# Patient Record
Sex: Female | Born: 1950
Health system: Southern US, Community
[De-identification: ages and names within clinical notes are randomized; demographics above are authoritative.]

## PROBLEM LIST (undated history)

## (undated) DIAGNOSIS — Z9109 Other allergy status, other than to drugs and biological substances: Secondary | ICD-10-CM

## (undated) DIAGNOSIS — E7800 Pure hypercholesterolemia, unspecified: Secondary | ICD-10-CM

## (undated) DIAGNOSIS — G610 Guillain-Barre syndrome: Secondary | ICD-10-CM

## (undated) DIAGNOSIS — Z8619 Personal history of other infectious and parasitic diseases: Secondary | ICD-10-CM

## (undated) DIAGNOSIS — H9190 Unspecified hearing loss, unspecified ear: Secondary | ICD-10-CM

## (undated) HISTORY — PX: MEDIAL PARTIAL KNEE REPLACEMENT: SHX5965

## (undated) HISTORY — DX: Guillain-Barre syndrome: G61.0

## (undated) HISTORY — PX: KNEE ARTHROSCOPY: SUR90

## (undated) HISTORY — DX: Unspecified hearing loss, unspecified ear: H91.90

## (undated) HISTORY — PX: ABDOMINAL HYSTERECTOMY: SHX81

## (undated) HISTORY — DX: Personal history of other infectious and parasitic diseases: Z86.19

## (undated) HISTORY — PX: FOOT SURGERY: SHX648

## (undated) HISTORY — PX: EYE SURGERY: SHX253

---

## 1997-04-27 ENCOUNTER — Other Ambulatory Visit: Admission: RE | Admit: 1997-04-27 | Discharge: 1997-04-27 | Payer: Self-pay | Admitting: Obstetrics and Gynecology

## 1998-06-30 ENCOUNTER — Other Ambulatory Visit: Admission: RE | Admit: 1998-06-30 | Discharge: 1998-06-30 | Payer: Self-pay | Admitting: Obstetrics and Gynecology

## 1999-06-26 ENCOUNTER — Encounter: Admission: RE | Admit: 1999-06-26 | Discharge: 1999-06-26 | Payer: Self-pay | Admitting: Obstetrics and Gynecology

## 1999-06-26 ENCOUNTER — Encounter: Payer: Self-pay | Admitting: Obstetrics and Gynecology

## 1999-07-18 ENCOUNTER — Other Ambulatory Visit: Admission: RE | Admit: 1999-07-18 | Discharge: 1999-07-18 | Payer: Self-pay | Admitting: Obstetrics and Gynecology

## 2000-07-02 ENCOUNTER — Encounter: Payer: Self-pay | Admitting: Obstetrics and Gynecology

## 2000-07-02 ENCOUNTER — Encounter: Admission: RE | Admit: 2000-07-02 | Discharge: 2000-07-02 | Payer: Self-pay | Admitting: Obstetrics and Gynecology

## 2000-08-07 ENCOUNTER — Other Ambulatory Visit: Admission: RE | Admit: 2000-08-07 | Discharge: 2000-08-07 | Payer: Self-pay | Admitting: Obstetrics and Gynecology

## 2001-07-03 ENCOUNTER — Encounter: Payer: Self-pay | Admitting: Obstetrics and Gynecology

## 2001-07-03 ENCOUNTER — Encounter: Admission: RE | Admit: 2001-07-03 | Discharge: 2001-07-03 | Payer: Self-pay | Admitting: Obstetrics and Gynecology

## 2002-07-06 ENCOUNTER — Encounter: Admission: RE | Admit: 2002-07-06 | Discharge: 2002-07-06 | Payer: Self-pay | Admitting: Obstetrics and Gynecology

## 2002-07-06 ENCOUNTER — Encounter: Payer: Self-pay | Admitting: Obstetrics and Gynecology

## 2003-07-09 ENCOUNTER — Encounter: Admission: RE | Admit: 2003-07-09 | Discharge: 2003-07-09 | Payer: Self-pay | Admitting: Obstetrics and Gynecology

## 2004-10-06 ENCOUNTER — Encounter: Admission: RE | Admit: 2004-10-06 | Discharge: 2004-10-06 | Payer: Self-pay | Admitting: Obstetrics and Gynecology

## 2005-10-15 ENCOUNTER — Encounter: Admission: RE | Admit: 2005-10-15 | Discharge: 2005-10-15 | Payer: Self-pay | Admitting: Obstetrics and Gynecology

## 2005-10-22 ENCOUNTER — Encounter: Admission: RE | Admit: 2005-10-22 | Discharge: 2005-10-22 | Payer: Self-pay | Admitting: Obstetrics and Gynecology

## 2007-09-07 ENCOUNTER — Emergency Department (HOSPITAL_BASED_OUTPATIENT_CLINIC_OR_DEPARTMENT_OTHER): Admission: EM | Admit: 2007-09-07 | Discharge: 2007-09-07 | Payer: Self-pay | Admitting: Emergency Medicine

## 2007-11-23 ENCOUNTER — Emergency Department (HOSPITAL_BASED_OUTPATIENT_CLINIC_OR_DEPARTMENT_OTHER): Admission: EM | Admit: 2007-11-23 | Discharge: 2007-11-23 | Payer: Self-pay | Admitting: Emergency Medicine

## 2008-07-14 ENCOUNTER — Emergency Department (HOSPITAL_BASED_OUTPATIENT_CLINIC_OR_DEPARTMENT_OTHER): Admission: EM | Admit: 2008-07-14 | Discharge: 2008-07-14 | Payer: Self-pay | Admitting: Emergency Medicine

## 2008-08-09 ENCOUNTER — Inpatient Hospital Stay (HOSPITAL_COMMUNITY): Admission: RE | Admit: 2008-08-09 | Discharge: 2008-08-10 | Payer: Self-pay | Admitting: Orthopedic Surgery

## 2008-08-18 ENCOUNTER — Emergency Department (HOSPITAL_BASED_OUTPATIENT_CLINIC_OR_DEPARTMENT_OTHER): Admission: EM | Admit: 2008-08-18 | Discharge: 2008-08-19 | Payer: Self-pay | Admitting: Emergency Medicine

## 2008-08-30 ENCOUNTER — Inpatient Hospital Stay (HOSPITAL_COMMUNITY): Admission: RE | Admit: 2008-08-30 | Discharge: 2008-08-31 | Payer: Self-pay | Admitting: Orthopedic Surgery

## 2010-01-22 LAB — HM COLONOSCOPY: HM COLON: NORMAL

## 2010-04-29 LAB — BASIC METABOLIC PANEL
BUN: 11 mg/dL (ref 6–23)
BUN: 12 mg/dL (ref 6–23)
CO2: 27 mEq/L (ref 19–32)
Calcium: 8.7 mg/dL (ref 8.4–10.5)
Calcium: 9.7 mg/dL (ref 8.4–10.5)
Chloride: 105 mEq/L (ref 96–112)
Creatinine, Ser: 0.71 mg/dL (ref 0.4–1.2)
GFR calc Af Amer: >60 mL/min (ref >60)
GFR calc Af Amer: >60 mL/min (ref >60)
GFR calc non Af Amer: >60 mL/min (ref >60)
Glucose, Bld: 190 mg/dL — ABNORMAL HIGH (ref 70–99)
Glucose, Bld: 97 mg/dL (ref 70–99)
Potassium: 3.7 mEq/L (ref 3.5–5.1)
Potassium: 4.4 mEq/L (ref 3.5–5.1)
Sodium: 141 mEq/L (ref 135–145)

## 2010-04-29 LAB — TYPE AND SCREEN: ABO/RH(D): O POS

## 2010-04-29 LAB — URINE MICROSCOPIC-ADD ON

## 2010-04-29 LAB — DIFFERENTIAL
Basophils Relative: 0 % (ref 0–1)
Eosinophils Absolute: 0.2 10*3/uL (ref 0.0–0.7)
Eosinophils Relative: 3 % (ref 0–5)
Monocytes Absolute: 0.3 10*3/uL (ref 0.1–1.0)
Monocytes Relative: 6 % (ref 3–12)

## 2010-04-29 LAB — URINALYSIS, ROUTINE W REFLEX MICROSCOPIC
Bilirubin Urine: NEGATIVE
Glucose, UA: NEGATIVE mg/dL
Specific Gravity, Urine: 1.021 (ref 1.005–1.030)
Urobilinogen, UA: 0.2 mg/dL (ref 0.0–1.0)

## 2010-04-29 LAB — CBC
HCT: 38.5 % (ref 36.0–46.0)
Hemoglobin: 10.2 g/dL — ABNORMAL LOW (ref 12.0–15.0)
Hemoglobin: 13.3 g/dL (ref 12.0–15.0)
Platelets: 221 10*3/uL (ref 150–400)
RDW: 13.4 % (ref 11.5–15.5)

## 2010-04-29 LAB — PROTIME-INR: INR: 1 (ref 0.00–1.49)

## 2010-04-29 LAB — APTT: aPTT: 27 seconds (ref 24–37)

## 2010-04-30 LAB — BASIC METABOLIC PANEL
BUN: 12 mg/dL (ref 6–23)
BUN: 7 mg/dL (ref 6–23)
CO2: 30 mEq/L (ref 19–32)
Chloride: 105 mEq/L (ref 96–112)
Chloride: 109 mEq/L (ref 96–112)
Creatinine, Ser: 0.7 mg/dL (ref 0.4–1.2)
Creatinine, Ser: 0.73 mg/dL (ref 0.4–1.2)
GFR calc Af Amer: >60 mL/min (ref >60)
GFR calc non Af Amer: >60 mL/min (ref >60)
Sodium: 141 mEq/L (ref 135–145)
Sodium: 144 mEq/L (ref 135–145)

## 2010-04-30 LAB — CBC
Hemoglobin: 11 g/dL — ABNORMAL LOW (ref 12.0–15.0)
Hemoglobin: 13.5 g/dL (ref 12.0–15.0)
MCHC: 34.2 g/dL (ref 30.0–36.0)
MCHC: 34.4 g/dL (ref 30.0–36.0)
MCV: 86.3 fL (ref 78.0–100.0)
MCV: 86.6 fL (ref 78.0–100.0)
RBC: 3.72 MIL/uL — ABNORMAL LOW (ref 3.87–5.11)
RDW: 12.5 % (ref 11.5–15.5)
RDW: 12.8 % (ref 11.5–15.5)

## 2010-04-30 LAB — URINALYSIS, ROUTINE W REFLEX MICROSCOPIC
Nitrite: NEGATIVE
Protein, ur: NEGATIVE mg/dL
Urobilinogen, UA: 0.2 mg/dL (ref 0.0–1.0)

## 2010-04-30 LAB — DIFFERENTIAL
Basophils Relative: 1 % (ref 0–1)
Eosinophils Absolute: 0.1 10*3/uL (ref 0.0–0.7)
Neutro Abs: 1.6 10*3/uL — ABNORMAL LOW (ref 1.7–7.7)

## 2010-04-30 LAB — ABO/RH: ABO/RH(D): O POS

## 2010-04-30 LAB — APTT: aPTT: 29 seconds (ref 24–37)

## 2010-04-30 LAB — PROTIME-INR
INR: 1 (ref 0.00–1.49)
Prothrombin Time: 13.2 seconds (ref 11.6–15.2)

## 2010-06-06 NOTE — Op Note (Signed)
[name], [name]              ACCOUNT NO.:  [id number]   MEDICAL RECORD NO.:  [id number]          PATIENT TYPE:  INP   LOCATION:  1611                         FACILITY:  [facility]   PHYSICIAN:  Madlyn Frankel. Charlann Boxer, M.D.  DATE OF BIRTH:  05-29-1950   DATE OF PROCEDURE:  08/30/2008  DATE OF DISCHARGE:                               OPERATIVE REPORT   PREOPERATIVE DIAGNOSIS:  Right knee medial compartment osteoarthritis.   POSTOPERATIVE DIAGNOSIS:  Right knee medial compartment osteoarthritis.   ROCEDURE:  Right partial knee replacement utilizing a Biomet size medium  femoral component and A right medial tibial component size 6  polyethylene insert.   SURGEON:  Madlyn Frankel. Charlann Boxer, M.D.   ASSISTANT:  Dwyane Luo, PA-C   ANESTHESIA:  Spinal.   DRAINS:  One Hemovac.   TOURNIQUET TIME:  35 minutes 250 mmHg.   COMPLICATIONS:  None.   SPECIMENS:  None.   INDICATIONS FOR PROCEDURE:  [name] is a 60 year old female who  presented to the office with bilateral knee osteoarthritis.  We had  already done a left partial knee replacement that she had done very well  from.  She is here for scheduled staged bilateral partial knee  replacement.  We reviewed the risks and benefits at her first initial  postop from the left knee.  She is ready to proceed without hesitation.   PROCEDURE IN DETAIL:  The patient was brought to operative theater.  Once adequate anesthesia and preoperative antibiotics, Cleocin  administered, the patient was positioned supine.  The right leg was  placed in a proximal thigh tourniquet.  The right lower extremity was  then prepped and draped in sterile fashion.  A time-out was performed  identifying the patient, planned procedure and extremity.   A paramidline incision was made following exsanguination of the  extremity.  Sharp dissection was carried down to the extensor mechanism.  Following creation of soft tissue planes, a median arthrotomy was made.  A median  parapatellar arthrotomy was then made just proximal to the pole  patella.  Sharp dissection was carried out including synovectomy.  Following removal of osteophytes along the medial femoral condyle and  distally anteriorly, attention was first directed to the tibia.  The  extramedullary guide was placed on the proximal tibia, pinned in  position.  The reciprocating saw was used first followed by oscillating  saw to remove the proximal medial tibial wedge.   Following this, the cut surface would be best fit with a size A.   Following this cut, the femoral canal was opened with the drill and awl  used to increase the size of the opening.  Intramedullary rod was then  passed.   With a size 3 feeler gauge in place, the guide the guide for drill holes  into the medial femoral condyle was placed on top of the size 3 feeler  gauge.  Once it was parallel in both planes, the drill holes were made  into the midportion of medial femoral condyle.  The posterior cutting  block was then placed and the posterior cut made without difficulty.  I then used a size 4 spigot as I had already trialed f the size 4 feeler  gauge which felt good.  The distal femur was milled the excessive bone  was removed.   A trial reduction now carried out with a medium femur.  An A tibial tray  and a size 5 feeler gauge actually felt better initially.   It seemed to be balanced.  No further milling was taken care of on the  distal femur.   At this point, the trial components were removed.  I was able to get to  the posterior aspect of the knee and remove more meniscus.   Following the removal of the remaining tissues, the final preparation of  tibia was carried out.  The initial tray was pinned in position.  A  reciprocating saw was then used to remove the bone from the keeled area.  I then used an osteotome to create the keeled trough within the tibia  component.  Trial reduction now carried out with the medial  femur, a  size A keeled tibia component.  When I did the trial components.  I felt  that there going to be either a 5 or 6 final component based on the  balancing.  Nonetheless, the ligaments were balanced from extension to  flexion.   At this point, all trial components removed and we injected synovial  capsule junction with 0.25% Marcaine with epinephrine.   The cement was prepared and the final medium femur and A component was  opened.   The wound was irrigated with normal saline solution.  Final components  were cemented in position with a single cement but in two stages.  The  tibial component was cemented in first, held at 45 degrees of flexion  with a trial femur in place.  Extruded cement was removed, the final  medium femur was then cemented in place and again held at 45 degrees of  flexion with a size 5 feeler gauge.   Once the cement had cured and excess cement was removed, I retrialed.  During this retrial I chose to go up to a size 6 as my final component.  The final 6 component was then opened and snapped into position without  difficulty.   The knee was reirrigated with normal saline solution.  Tourniquet had  been let down after 35 minutes.  A medium Hemovac drains was placed  deep.  After final irrigation, the extensor mechanism was reapproximated  using 1-0  Vicryl.  The remaining wound was closed with 2-0 Vicryl and a running 4-  0 Monocryl.  The knee was cleaned, dried and dressed sterilely with  Steri-Strips and bulky sterile wrap and she was then brought to the  recovery room in stable condition tolerating the procedure well.      Madlyn Frankel Charlann Boxer, M.D.  Electronically Signed     MDO/MEDQ  D:  08/30/2008  T:  08/30/2008  Job:  [id number]

## 2010-06-06 NOTE — H&P (Signed)
[name], [name]              ACCOUNT NO.:  [id number]   MEDICAL RECORD NO.:  [id number]          PATIENT TYPE:  INP   LOCATION:                               FACILITY:  [facility]   PHYSICIAN:  Madlyn Frankel. Charlann Boxer, M.D.  DATE OF BIRTH:  1950-06-26   DATE OF ADMISSION:  08/30/2008  DATE OF DISCHARGE:                              HISTORY & PHYSICAL   PROCEDURE:  Right unicondylar knee replacement of the medial  compartment.   CHIEF COMPLAINT:  Right knee pain.   HISTORY OF PRESENT ILLNESS:  A 60 year old female with a history of  right medial knee pain with medial compartment osteoarthritis.  It has  been refractory to all conservative treatment.  She has a recent history  of a left partial knee replacement and has done well with this.   PRIMARY CARE PHYSICIAN:  Dr. Derrell Lolling.   PAST MEDICAL HISTORY:  1. Osteoarthritis.  2. Vertigo.  3. Impaired hearing.   PAST SURGICAL HISTORY:  1. Left partial knee replacement.  2. Partial hysterectomy.  3. Toe surgery.  4. LASIK surgery right eye.   FAMILY HISTORY:  Arthritis, heart disease, hypertension, reflux disease,  COPD, kidney disease.   SOCIAL HISTORY:  Married, nonsmoker.  Primary caregiver is spouse in the  home.   DRUG ALLERGIES:  1. PENICILLIN causes hives and swelling.  2. DARVOCET causes severe swelling including swelling of the tongue.  3. HYDROCODONE severe itching.  4. ROBAXIN severe headache.   MEDICATIONS:  1. Claritin over-the-counter daily.  2. Vitamin C daily.  3. Calcium with vitamin D b.i.d.  4. Tylenol Arthritis p.r.n.  5. Tramadol 50 mg one to two p.o. q.4-6 hours p.r.n. pain.  6. Mobic 7.5 mg one p.o. b.i.d. x2 weeks after surgery.   REVIEW OF SYSTEMS:  HEENT/NEUROLOGICAL:  She has some hearing loss.  RESPIRATORY: She has allergies.  MUSCULOSKELETAL:  Joint pain.  Otherwise see HPI.   PHYSICAL EXAMINATION:  VITAL SIGNS:  Pulse 72, respirations 16, blood  pressure 134/88.  GENERAL:  Awake, alert and  oriented.  HEENT:  Normocephalic.  NECK:  Supple.  No carotid bruits.  CHEST:  Lungs clear to auscultation bilaterally.  BREASTS:  Deferred.  HEART:  S1-S2 distinct.  ABDOMEN:  Soft, nontender, bowel sounds present.  PELVIS:  Stable.  GENITOURINARY:  Deferred.  EXTREMITIES:  Left knee she is already 3-110.  Right knee shows medial  sided tenderness.  Normal range of motion.  SKIN:  No cellulitis.  NEUROLOGIC:  Intact distal sensibilities.   LABORATORY DATA:  Labs, EKG, chest x-ray pending.   IMPRESSION:  Right knee osteoarthritis of the medial compartment.   PLAN OF ACTION:  Is a right unicondylar knee replacement of the medial  compartment by Dr. Charlann Boxer at [facility] on August 30, 2008.  The risks and  complications were discussed.     ______________________________  [name], [place]      Madlyn Frankel. Charlann Boxer, M.D.  Electronically Signed    BLM/MEDQ  D:  08/26/2008  T:  08/26/2008  Job:  [id number]   cc:   Dr Derrell Lolling

## 2010-06-06 NOTE — Op Note (Signed)
[name], [name]              ACCOUNT NO.:  [id number]   MEDICAL RECORD NO.:  [id number]          PATIENT TYPE:  INP   LOCATION:  1611                         FACILITY:  [facility]   PHYSICIAN:  Madlyn Frankel. Charlann Boxer, M.D.  DATE OF BIRTH:  12/01/1950   DATE OF PROCEDURE:  DATE OF DISCHARGE:                               OPERATIVE REPORT   PREOPERATIVE DIAGNOSIS:  Left knee medial compartment osteoarthritis.   POSTOPERATIVE DIAGNOSIS:  Left knee medial compartment osteoarthritis.   PROCEDURE:  Left partial knee replacement using Biomet Oxford knee  system size medium femur, A left medial tibial tray and 5 insert matched  the medial femur.   SURGEON:  Madlyn Frankel. Charlann Boxer, M.D.   ASSISTANT:  Yetta Glassman. Loreta Ave, PA   ANESTHESIA:  Spinal.   SPECIMENS:  None.   COMPLICATIONS:  None.   DRAINS:  One Hemovac.   TOURNIQUET TIME:  42 minutes at 250 mmHg.   INDICATIONS FOR PROCEDURE:  [name] is a healthy 60 year old female  who we have been following for bilateral knee osteoarthritis  predominantly with genu varum with pain and symptoms medially failing to  respond to conservative measures  It was felt she was an ideal candidate  at this point based on the location of pain and radiographs for a  partial knee replacement.  We discussed the risks and benefits, pros and  cons of partial versus total knee replacement.  Consent was obtained for  partial knee replacement.   PROCEDURE IN DETAIL:  The patient was brought to operative theater.  Once adequate anesthesia, preoperative antibiotics, clindamycin  administered, the patient was positioned supine with a thigh tourniquet  placed.  The left lower extremity was prepped and draped in sterile  fashion.  Time-out was performed identifying the patient, planned  procedure, extremity.  The leg was exsanguinated, tourniquet elevated to  250 mmHg.  Paramidline incision was made from the patella to the tibial  tubercle.  Soft tissue planes created.   Arthrotomy was created.  Following debridement of synovium, I removed osteophytes on the distal  aspect of the femur.   Using extramedullary guide placed just beneath the osteophytic rim of  the proximal and medial tibia, we made a resection with reciprocating  saw onto the notch into the area of origin of ACL then using oscillating  saw, removed the anterior proximal tibia fragment.   This was done perpendicular in both planes.   The femoral canal was then opened with a drill, a starting awl was used  to open this up a bit more.  I then placed an intramedullary rod.  With  a size 3 feeler gauge on top of an A tibial tray, the drill holes for  the posterior cutting guide were then made into the middle portion of  medial femoral condyle in relationship to the intramedullary rod.   Based on the fact that with an A tibial tray in place and the fact that  the 4 feeler gauge passed with appropriate amount of tension with the  knee in 90 degrees of flexion,  I went ahead and placed the 4  spigot and  milled the distal femur.  I removed excessive bone.  Trial reduction was  now carried out with a medium femur, A tibial tray with a 4 insert.  It  felt good in flexion but when I came to extension, it was a little bit  more snug.   Based on this, I went ahead and removed that and placed a 5 spigot and  milled the distal femur.   Retrialing confirmed the balancing of the knee from extension to  flexion.  At this point following further debridements on an as needed  basis around the knee, we did final preparation of the tibia.  With the  tibial tray held into position, it was pinned with a reciprocating saw  to create the trough.  I used the osteotome to remove some of the bone  from this area to allow the keel to sit on it.  Trial reduction now  carried out with a medium femur, keeled A tibial tray and the lollipop  trials with a 5 feeler gauge.  At 90 degrees of flexion there was 1 to 2  mm  of play and then at 20 degrees of flexion the same.  Based on this,  the trial components were removed.  I drilled holes into the sclerotic  distal femur.   Final components were opened excluding the polyethylene at this point.  Cement was mixed.  The tibial component was cemented in first.  The  trial femur placed and the knee brought to 45 degrees of flexion for 2  minutes.  The femoral component was then cemented into position and  excessive cement removed with the knee held at 45 degrees of flexion.  Once cement cured, excessive cement was removed throughout the knee.  I  was unable visualize any remaining cement medially, laterally, or  posteriorly.  Once I was satisfied with this, I retrialed and was happy  with a size 5.  The final polyethylene insert was opened and snapped  into position.  We reirrigated the knee at this point, the tourniquet  was let down at 42 minutes, there was no significant hemostasis  required.   A medium Hemovac drain was placed deep.  The extensor mechanism was then  reapproximated using 1-0 Vicryl with the knee in flexion overtop a  Hemovac drain.  The remainder of the wound was closed with 2-0 Vicryl  and running 4-0 Monocryl.  The knee was cleaned, dried, dressed  sterilely with Steri-Strips and bulky sterile wrap.  She was brought to  the recovery room in stable condition tolerating the procedure well.      Madlyn Frankel Charlann Boxer, M.D.  Electronically Signed     MDO/MEDQ  D:  08/09/2008  T:  08/09/2008  Job:  [id number]

## 2010-06-06 NOTE — Discharge Summary (Signed)
NAMEMILAINA, [name]              ACCOUNT NO.:  [id number]   MEDICAL RECORD NO.:  [id number]          PATIENT TYPE:  INP   LOCATION:  1611                         FACILITY:  [facility]   PHYSICIAN:  Madlyn Frankel. Charlann Boxer, M.D.  DATE OF BIRTH:  [birth year]   DATE OF ADMISSION:  08/09/2008  DATE OF DISCHARGE:  08/10/2008                               DISCHARGE SUMMARY   ADMITTING DIAGNOSES:  1. Osteoarthritis.  2. Vertigo.  3. Impaired hearing.   DISCHARGE DIAGNOSES:  1. Osteoarthritis.  2. Vertigo.  3. Impaired hearing.   HISTORY OF PRESENT ILLNESS:  A 60 year old female with a history of left  medial knee pain secondary to medial compartment osteoarthritis.  Refractory to all conservative treatment.   PROCEDURE:  Left unicondylar knee replacement of the medial compartment  by surgeon Dr. Durene Romans, assistant Dwyane Luo, PA-C.   CONSULTATION:  None.   LABORATORY DATA:  CBC:  White blood cell 4.7, hemoglobin 11, hematocrit  32.2, platelets 153.  Metabolic:  Sodium 141, potassium 4, BUN 7,  creatinine 0.7, glucose 116.   HOSPITAL COURSE:  The patient underwent a left partial knee replacement  by surgeon Dr. Charlann Boxer, admitted to orthopedic floor.  Her stay was  unremarkable.  She remained hemodynamically orthopedically stable  throughout the course of her stay.  Day #1 dressing was changed.  She  was neurovascular intact, able to do straight leg raise.  Underwent  physical therapy with recommendations for home health care PT.  She was  stable and met criteria for discharge home.   DISCHARGE DISPOSITION:  Discharged home in stable improved condition  with home health care PT.   DISCHARGE INSTRUCTIONS:  1. Activity.  Discharge physical therapy, weightbearing as tolerated.  2. Diet.  Heart-healthy.  3. Wound care.  Keep dry.   DISCHARGE MEDICATIONS:  1. Aspirin 325 mg one p.o. b.i.d. times 6 weeks.  2. Robaxin 500 mg one p.o. q.6  3. Iron 325 mg one p.o. t.i.d.  4. Colace 100 mg p.o.  b.i.d.  5. MiraLax 17 grams p.o. daily.  6. Norco 7.5/325 one to two p.o. q. 4-6 p.r.n. pain.  7. Loratadine 10 mg p.o. daily.  8. Multivitamin daily.  9. Vitamin C 1000 mg daily.  10.Calcium plus D daily.  11.TUMS p.r.n.  12.Celebrex 200 mg one p.o. b.i.d. x2 weeks after surgery.   DISCHARGE FOLLOWUP:  Follow with Dr. Charlann Boxer at phone number [phone] in 2  weeks for wound check.     ______________________________  Yetta Glassman. Loreta Ave, [place]      Madlyn Frankel. Charlann Boxer, M.D.  Electronically Signed    BLM/MEDQ  D:  08/10/2008  T:  08/10/2008  Job:  [id number]   cc:   Dr. Gabriel Rung

## 2010-06-09 NOTE — Discharge Summary (Signed)
[name], [name]              ACCOUNT NO.:  [id number]   MEDICAL RECORD NO.:  [id number]          PATIENT TYPE:  INP   LOCATION:  1611                         FACILITY:  [facility]   PHYSICIAN:  Madlyn Frankel. Charlann Boxer, M.D.  DATE OF BIRTH:  [birth year]   DATE OF ADMISSION:  08/30/2008  DATE OF DISCHARGE:  08/31/2008                               DISCHARGE SUMMARY   ADMITTING DIAGNOSES:  1. Osteoarthritis.  2. Vertigo.  3. Impaired hearing.   DISCHARGE DIAGNOSES:  1. Osteoarthritis.  2. Vertigo.  3. Impaired hearing.   HISTORY OF PRESENT ILLNESS:  A 60 year old female with a history of  right knee pain secondary to medial compartment osteoarthritis.   CONSULTATION:  None.   PROCEDURE:  A right unicondylar knee replacement by Dr. Charlann Boxer.  Assistant  Dwyane Luo PA.   LABORATORY DATA:  CBC:  White blood cell 10.1, hemoglobin 10.1,  hematocrit 30.1, platelets 221.  Metabolic:  Sodium 139, potassium 4.4,  BUN 11, creatinine 0.71, glucose 190.   HOSPITAL COURSE:  The patient underwent a right partial knee  replacement.  Admitted to the orthopedic floor.  Her stay was  unremarkable.  She remained hemodynamically and orthopedically stable.  Dressing was noted to be clean, dry, and intact on day #1.  She is  neurovascularly intact with good quad function.  She was weightbearing  as tolerated, and was ready for discharge home.   DISCHARGE DISPOSITION:  Discharged home in stable improved condition  with home health care, PT.   DISCHARGE DIET:  Heart-healthy.   DISCHARGE WOUND CARE:  Keep dry.   DISCHARGE PHYSICAL THERAPY:  Weightbearing as tolerated.   DISCHARGE MEDICATIONS:  1. Aspirin 325 p.o. b.i.d.  2. Flexeril 7.5 mg p.o. q.6.  3. Mobic 7.5 mg p.o. daily.  4. Iron 325 mg p.o. t.i.d.  5. Colace 100 mg p.o. b.i.d.  6. Tramadol 50 mg 1-2 p.o. q.4-6 p.r.n. pain.  7. Claritin p.r.n.   DISCHARGE FOLLOW-UP:  With Dr. Charlann Boxer at the office [phone]  in 2 weeks  for wound check.     ______________________________  Yetta Glassman. Loreta Ave, [place]      Madlyn Frankel. Charlann Boxer, M.D.  Electronically Signed    BLM/MEDQ  D:  09/06/2008  T:  09/06/2008  Job:  [id number]

## 2010-06-09 NOTE — H&P (Signed)
NAMEMERCEDE, ROLLO              ACCOUNT NO.:  [id number]   MEDICAL RECORD NO.:  [id number]          PATIENT TYPE:  INP   LOCATION:                               FACILITY:  [facility]   PHYSICIAN:  Madlyn Frankel. Charlann Boxer, M.D.  DATE OF BIRTH:  01/25/1950   DATE OF ADMISSION:  08/09/2008  DATE OF DISCHARGE:                              HISTORY & PHYSICAL   PROCEDURE:  Left unicondylar knee replacement of the medial compartment.   CHIEF COMPLAINT:  Left medial knee pain.   HISTORY OF PRESENT ILLNESS:  A 60 year old female with a history of left  medial knee pain secondary to medial compartment osteoarthritis.  It has  been refractory to all conservative treatment.  She also has a known  history of some right knee pain as well.   PRIMARY CARE PHYSICIAN:  Dr. Derrell Lolling.   PAST MEDICAL HISTORY:  1. Osteoarthritis.  2. Vertigo.  3. Impaired hearing.   PAST SURGICAL HISTORY:  1. Partial hysterectomy.  2. Toe surgery.  3. LASIK surgery of right eye.   FAMILY HISTORY:  Arthritis, heart disease, hypertension, reflux disease,  COPD, kidney disease.   SOCIAL HISTORY:  She is married.  She is a nonsmoker.  Primary caregiver  is spouse in the home postoperatively.   DRUG ALLERGIES:  PENICILLIN.   CURRENT MEDICATIONS:  1. Claritin over the counter.  2. Vitamin C daily.  3. Calcium with vitamin D b.i.d.  4. Tylenol Arthritis p.r.n.  5. Tramadol 50 mg one to two p.o. q.4-6h. p.r.n. pain.  6. Celebrex 200 mg one p.o. b.i.d. x2 weeks after surgery.   REVIEW OF SYSTEMS:  HEENT/NEUROLOGICAL:  She has some hearing loss.  RESPIRATORY:  She has allergies.  MUSCULOSKELETAL:  She has joint pain.  Otherwise see HPI.   PHYSICAL EXAMINATION:  VITAL SIGNS:  Pulse 72, respirations 16, blood  pressure 138/80.  GENERAL:  Awake, alert and oriented, well-developed, well-nourished.  HEENT:  Normocephalic.  NECK:  Supple.  No carotid bruits.  CHEST:  Lungs clear to auscultation bilaterally.  BREASTS:   Deferred.  HEART:  Regular rate and rhythm, S1, S2 distinct.  ABDOMEN:  The abdomen is soft.  Bowel sounds are present.  PELVIC:  Stable.  GENITOURINARY:  Deferred.  EXTREMITIES:  She has left medial knee pain, neutral alignment.  SKIN:  No cellulitis.  NEUROLOGICAL:  Intact distal sensibilities.   LABORATORY DATA:  Labs, EKG, chest x-ray all pending presurgical  testing.   IMPRESSION:  Left medial knee osteoarthritis.   PLAN OF ACTION:  The plan of action is a left unicondylar knee  replacement of the medial compartment by Dr. Charlann Boxer at [facility] on August 09, 2008.  Questions were encouraged and answers  reviewed.   Postoperative medications were provided including aspirin for deep  venous thrombosis prophylaxis.     ______________________________  Yetta Glassman [address]oreta [address], [place]      Madlyn Frankel. Charlann Boxer, M.D.  Electronically Signed    BLM/MEDQ  D:  07/16/2008  T:  07/16/2008  Job:  [id number]   cc:   Dr. Derrell Lolling

## 2010-10-24 LAB — COMPREHENSIVE METABOLIC PANEL
ALT: 13
AST: 25
Alkaline Phosphatase: 86
BUN: 13
CO2: 31
Calcium: 10.3
GFR calc non Af Amer: >60
Glucose, Bld: 79
Potassium: 3.8

## 2010-10-24 LAB — URINALYSIS, ROUTINE W REFLEX MICROSCOPIC
Nitrite: NEGATIVE
Specific Gravity, Urine: 1.011
Urobilinogen, UA: 0.2
pH: 6.5

## 2010-10-24 LAB — DIFFERENTIAL
Eosinophils Relative: 4
Lymphocytes Relative: 37
Lymphs Abs: 1.9
Monocytes Absolute: 0.4
Monocytes Relative: 8
Neutro Abs: 2.6

## 2010-10-24 LAB — CBC
HCT: 43.1
MCHC: 34.3
MCV: 85.8
RDW: 12.1
WBC: 5.1

## 2010-10-24 LAB — POCT CARDIAC MARKERS
CKMB, poc: <1 — ABNORMAL LOW
Troponin i, poc: <0.05
Troponin i, poc: <0.05

## 2010-10-24 LAB — PROTIME-INR: INR: 1

## 2010-10-24 LAB — APTT: aPTT: 30

## 2013-07-20 DIAGNOSIS — M8580 Other specified disorders of bone density and structure, unspecified site: Secondary | ICD-10-CM | POA: Insufficient documentation

## 2013-07-20 DIAGNOSIS — M1990 Unspecified osteoarthritis, unspecified site: Secondary | ICD-10-CM | POA: Insufficient documentation

## 2014-02-04 DIAGNOSIS — K219 Gastro-esophageal reflux disease without esophagitis: Secondary | ICD-10-CM | POA: Insufficient documentation

## 2014-03-18 ENCOUNTER — Other Ambulatory Visit: Payer: Self-pay | Admitting: Internal Medicine

## 2014-03-18 DIAGNOSIS — R928 Other abnormal and inconclusive findings on diagnostic imaging of breast: Secondary | ICD-10-CM

## 2014-03-29 ENCOUNTER — Other Ambulatory Visit: Payer: Self-pay | Admitting: Obstetrics and Gynecology

## 2014-03-29 DIAGNOSIS — R928 Other abnormal and inconclusive findings on diagnostic imaging of breast: Secondary | ICD-10-CM

## 2014-03-30 ENCOUNTER — Ambulatory Visit
Admission: RE | Admit: 2014-03-30 | Discharge: 2014-03-30 | Disposition: A | Discharge status: Home / Self Care | Payer: Managed Care, Other (non HMO) | Source: Ambulatory Visit | Attending: Internal Medicine | Admitting: Internal Medicine

## 2014-03-30 ENCOUNTER — Other Ambulatory Visit: Payer: Self-pay | Admitting: Obstetrics and Gynecology

## 2014-03-30 DIAGNOSIS — R928 Other abnormal and inconclusive findings on diagnostic imaging of breast: Secondary | ICD-10-CM

## 2014-10-16 ENCOUNTER — Encounter (HOSPITAL_BASED_OUTPATIENT_CLINIC_OR_DEPARTMENT_OTHER): Payer: Self-pay | Admitting: Emergency Medicine

## 2014-10-16 ENCOUNTER — Emergency Department (HOSPITAL_BASED_OUTPATIENT_CLINIC_OR_DEPARTMENT_OTHER): Payer: Managed Care, Other (non HMO)

## 2014-10-16 ENCOUNTER — Emergency Department (HOSPITAL_BASED_OUTPATIENT_CLINIC_OR_DEPARTMENT_OTHER)
Admission: EM | Admit: 2014-10-16 | Discharge: 2014-10-16 | Disposition: A | Discharge status: Home / Self Care | Payer: Managed Care, Other (non HMO) | Attending: Physician Assistant | Admitting: Physician Assistant

## 2014-10-16 DIAGNOSIS — M25512 Pain in left shoulder: Secondary | ICD-10-CM

## 2014-10-16 DIAGNOSIS — E780 Pure hypercholesterolemia: Secondary | ICD-10-CM | POA: Diagnosis not present

## 2014-10-16 HISTORY — DX: Pure hypercholesterolemia, unspecified: E78.00

## 2014-10-16 HISTORY — DX: Other allergy status, other than to drugs and biological substances: Z91.09

## 2014-10-16 LAB — COMPREHENSIVE METABOLIC PANEL
ALT: 31 U/L (ref 14–54)
ANION GAP: 8 (ref 5–15)
AST: 29 U/L (ref 15–41)
Albumin: 3.9 g/dL (ref 3.5–5.0)
Alkaline Phosphatase: 80 U/L (ref 38–126)
BILIRUBIN TOTAL: 1.9 mg/dL — AB (ref 0.3–1.2)
BUN: 19 mg/dL (ref 6–20)
CO2: 27 mmol/L (ref 22–32)
Calcium: 9.2 mg/dL (ref 8.9–10.3)
Chloride: 105 mmol/L (ref 101–111)
Creatinine, Ser: 0.82 mg/dL (ref 0.44–1.00)
Glucose, Bld: 121 mg/dL — ABNORMAL HIGH (ref 65–99)
POTASSIUM: 3.6 mmol/L (ref 3.5–5.1)
Sodium: 140 mmol/L (ref 135–145)
TOTAL PROTEIN: 7.4 g/dL (ref 6.5–8.1)

## 2014-10-16 LAB — CBC WITH DIFFERENTIAL/PLATELET
Basophils Absolute: 0 10*3/uL (ref 0.0–0.1)
Basophils Relative: 1 %
Eosinophils Absolute: 0.2 10*3/uL (ref 0.0–0.7)
Eosinophils Relative: 3 %
HEMATOCRIT: 37.5 % (ref 36.0–46.0)
Hemoglobin: 12 g/dL (ref 12.0–15.0)
LYMPHS ABS: 1.8 10*3/uL (ref 0.7–4.0)
LYMPHS PCT: 31 %
MCH: 27.5 pg (ref 26.0–34.0)
MCHC: 32 g/dL (ref 30.0–36.0)
MCV: 85.8 fL (ref 78.0–100.0)
MONO ABS: 0.6 10*3/uL (ref 0.1–1.0)
MONOS PCT: 10 %
NEUTROS ABS: 3.3 10*3/uL (ref 1.7–7.7)
Neutrophils Relative %: 55 %
Platelets: 172 10*3/uL (ref 150–400)
RBC: 4.37 MIL/uL (ref 3.87–5.11)
RDW: 14 % (ref 11.5–15.5)
WBC: 5.9 10*3/uL (ref 4.0–10.5)

## 2014-10-16 LAB — TROPONIN I

## 2014-10-16 NOTE — Discharge Instructions (Signed)
We are unsure what is causing your symptoms.  YOu should follow up with your regular phsyciian this week. You may need to see a cardiologist to risk stratify you.

## 2014-10-16 NOTE — ED Notes (Signed)
Patient states that she started to have pain to her left shoulder and down into her left arm. The patient reports that her shoulder and arm have become worse and reports that her back and her left face and cheek are feeling numb.

## 2014-10-16 NOTE — ED Provider Notes (Signed)
CSN: [id number]     Arrival date & time 10/16/14  2119 History  This chart was scribed for Anaih Brander Julio Alm, MD by Meriel Pica, ED Scribe. This patient was seen in room MH06/MH06 and the patient's care was started 10:04 PM.   Chief Complaint  Patient presents with  . Shoulder Pain   The history is provided by the patient. No language interpreter was used.   HPI Comments: [name] is a 64 y.o. female, with a PMhx of HLD, who presents to the Emergency Department complaining of worsening, constant, posterior left shoulder pain that radiates down left arm onset 22 hours ago last night while laying in bed. She reports that the pain subsided moderately this morning and throughout the day but worsened again this evening 2 hours ago. Pt reports onset of the pain this evening while she is sitting down resting. She does not note any modifying factors to her pain. The pt applied a heating pad last night with some relief of her left shoulder pain. PFhx of cardiomyopathies. Pt not currently smoking.  Denies fevers, trauma to left arm or shoulder, or PMhx of HTN. She is regularly followed by PCP.   Past Medical History  Diagnosis Date  . High cholesterol   . Environmental allergies    Past Surgical History  Procedure Laterality Date  . Knee arthroscopy    . Abdominal hysterectomy     History reviewed. No pertinent family history. Social History  Substance Use Topics  . Smoking status: Never Smoker   . Smokeless tobacco: None  . Alcohol Use: No   OB History    No data available     Review of Systems  Constitutional: Negative for fever.  Musculoskeletal: Positive for arthralgias ( left shoulder, left arm).  All other systems reviewed and are negative.  Allergies  Darvon; Vicodin; Methocarbamol; and Penicillins  Home Medications   Prior to Admission medications   Not on File   BP 176/87 mmHg  Pulse 81  Temp(Src) 98.2 F (36.8 C) (Oral)  Resp 18  Ht 5\' 8"  (1.727 m)   Wt 163 lb (73.936 kg)  BMI 24.79 kg/m2  SpO2 100% Physical Exam  Constitutional: She is oriented to person, place, and time. She appears well-developed and well-nourished. No distress.  NAD.  HENT:  Head: Normocephalic.  Mouth/Throat: Oropharynx is clear and moist. No oropharyngeal exudate.  Eyes: Conjunctivae are normal.  Neck: Neck supple.  Cardiovascular: Normal rate, regular rhythm, normal heart sounds and intact distal pulses.   No BLE edema.   Pulmonary/Chest: Effort normal and breath sounds normal. No respiratory distress.  Lungs clear to auscultation bilaterally.   Abdominal: Soft. There is no tenderness.  Musculoskeletal: Normal range of motion. She exhibits no edema.  TTP to posterior left shoulder.   Neurological: She is alert and oriented to person, place, and time. No cranial nerve deficit.  Cranial nerves 2-12 intact; alert and oriented X 3; MAE X 4.     ED Course  Procedures  DIAGNOSTIC STUDIES: Oxygen Saturation is 100% on RA, normal by my interpretation.    COORDINATION OF CARE: 10:11 PM Discussed treatment plan with pt which includes to order cardiac workup at bedside and pt agreed to plan.   Labs Review Labs Reviewed  CBC WITH DIFFERENTIAL/PLATELET  COMPREHENSIVE METABOLIC PANEL  TROPONIN I    Imaging Review No results found. I have personally reviewed and evaluated these images and lab results as part of my medical decision-making.   EKG  Interpretation None      EKG Interpretation  Date/Time:    Ventricular Rate:    PR Interval:    QRS Duration:   QT Interval:    QTC Calculation:   R Axis:     Text Interpretation:          I have entered this already in MUSE Rate 79 no acute ischemia. No sig change since recent tracing.   MDM   Final diagnoses:  None    Patient is a 64 year old female with past medical history significant for hyperlipidemia presents to the emergency Department complaining of left shoulder pain radiating to her  jaw that started last night. It occasionally happened during the course of today. Was not associated with exertion. Given the course of the pain we'll do single troponin here to rule out any kind of cardiac etiology.  Will do EKG chest x-ray and initial set of labs. Patient has good follow-up and promises to return with any increase in pain. She'll return follow-up with her primary care physician on Monday.  She does have extensive family history but has very few risk factors herself and is very atypical pain. She has pain with palpation of the left shoulder blade.   I personally performed the services described in this documentation, which was scribed in my presence. The recorded information has been reviewed and is accurate.    Katherleen Folkes Julio Alm, MD 10/16/14 2235

## 2015-02-23 DIAGNOSIS — J0120 Acute ethmoidal sinusitis, unspecified: Secondary | ICD-10-CM | POA: Diagnosis not present

## 2015-03-15 ENCOUNTER — Encounter: Payer: Self-pay | Admitting: Physician Assistant

## 2015-03-15 ENCOUNTER — Ambulatory Visit (INDEPENDENT_AMBULATORY_CARE_PROVIDER_SITE_OTHER): Payer: PPO | Admitting: Physician Assistant

## 2015-03-15 VITALS — BP 138/82 | HR 68 | Temp 97.7°F | Ht 66.75 in | Wt 166.8 lb

## 2015-03-15 DIAGNOSIS — Z91048 Other nonmedicinal substance allergy status: Secondary | ICD-10-CM

## 2015-03-15 DIAGNOSIS — J0110 Acute frontal sinusitis, unspecified: Secondary | ICD-10-CM | POA: Diagnosis not present

## 2015-03-15 DIAGNOSIS — E785 Hyperlipidemia, unspecified: Secondary | ICD-10-CM | POA: Diagnosis not present

## 2015-03-15 DIAGNOSIS — Z9109 Other allergy status, other than to drugs and biological substances: Secondary | ICD-10-CM

## 2015-03-15 MED ORDER — AZITHROMYCIN 250 MG PO TABS: ORAL_TABLET | ORAL | Status: DC

## 2015-03-15 MED FILL — AZITHROMYCIN 250 MG TABLET: 250 | 5 days supply | Qty: 6 | Fill #0

## 2015-03-15 NOTE — Patient Instructions (Signed)
Please continue chronic medications as directed.  Take the antibiotic as directed. Switch the zyrtec for Claritin. Continue the Flonase and your Neti pot. Follow-up if symptoms are not resolving.  Follow-up at earliest convenience for a Medicare Wellness and CPE.

## 2015-03-15 NOTE — Progress Notes (Signed)
Pre visit review using our clinic review tool, if applicable. No additional management support is needed unless otherwise documented below in the visit note. 

## 2015-03-15 NOTE — Progress Notes (Signed)
Patient presents to clinic today to establish care.  Acute Concerns: Patient with history of seasonal allergies, taking claritin daily. Endorses 2-3 weeks of increased sinus pressure, sinus pain and purulent nasal discharge. Denies fever or chills. Notes some facial pain.   Chronic Issues: Hyperlipidemia -- Endorses taking lipitor 40 mg daily as well as daily fish oils. Denies myalgia. Denies history of CAD, CVA or MI. Endorses recent labs with previous PCP.   Is followed by Dr. Newton Pigg (GYN) Is scheduled for her mammogram and bone density scan tomorrow. Is taking a calcium-vitamin D supplement daily.   Health Maintenance: Colonoscopy -- up-to-date per patient. Will obtain records. PAP --  S/p hysterectomy Bone Density --   Past Medical History  Diagnosis Date  . High cholesterol   . Environmental allergies   . History of chicken pox     Past Surgical History  Procedure Laterality Date  . Knee arthroscopy    . Abdominal hysterectomy    . Medial partial knee replacement  07-1998,08-1998  . Eye surgery    . Foot surgery      No current outpatient prescriptions on file prior to visit.   No current facility-administered medications on file prior to visit.    Allergies  Allergen Reactions  . Darvon [Propoxyphene] Swelling  . Vicodin [Hydrocodone-Acetaminophen]   . Methocarbamol Rash  . Penicillins Rash    Family History  Problem Relation Age of Onset  . COPD Mother   . Heart disease Mother   . Hypertension Father   . Crohn's disease Sister   . Hypertension Brother   . Kidney disease Son   . Tuberculosis Maternal Grandmother   . Leukemia Maternal Grandfather     Social History   Social History  . Marital Status: Married    Spouse Name: N/A  . Number of Children: N/A  . Years of Education: N/A   Occupational History  . Not on file.   Social History Main Topics  . Smoking status: Never Smoker   . Smokeless tobacco: Not on file  . Alcohol Use: No    . Drug Use: No  . Sexual Activity: Not on file   Other Topics Concern  . Not on file   Social History Narrative   Review of Systems  Constitutional: Negative for fever and malaise/fatigue.  HENT: Positive for congestion, hearing loss and sore throat. Negative for ear discharge and ear pain.   Eyes: Negative for blurred vision.  Respiratory: Positive for cough. Negative for sputum production and shortness of breath.   Cardiovascular: Negative for chest pain and palpitations.  Neurological: Positive for headaches. Negative for dizziness and loss of consciousness.  Psychiatric/Behavioral: Negative for depression, suicidal ideas, hallucinations and substance abuse. The patient is not nervous/anxious and does not have insomnia.    BP 138/82 mmHg  Pulse 68  Temp(Src) 97.7 F (36.5 C) (Oral)  Ht 5' 6.75" (1.695 m)  Wt 166 lb 12.8 oz (75.66 kg)  BMI 26.33 kg/m2  SpO2 100%  Physical Exam  Constitutional: She is oriented to person, place, and time and well-developed, well-nourished, and in no distress.  HENT:  Head: Normocephalic and atraumatic.  Right Ear: No middle ear effusion.  Left Ear:  No middle ear effusion.  Nose: Mucosal edema and rhinorrhea present. Right sinus exhibits frontal sinus tenderness. Left sinus exhibits frontal sinus tenderness.  Mouth/Throat: Uvula is midline, oropharynx is clear and moist and mucous membranes are normal.  Eyes: Conjunctivae are normal. Pupils are equal, round,  and reactive to light.  Neck: Neck supple. No thyromegaly present.  Cardiovascular: Normal rate, regular rhythm, normal heart sounds and intact distal pulses.   Pulmonary/Chest: Effort normal and breath sounds normal. No respiratory distress. She has no wheezes. She has no rales. She exhibits no tenderness.  Neurological: She is alert and oriented to person, place, and time.  Skin: Skin is warm and dry. No rash noted.  Psychiatric: Affect normal.  Vitals reviewed.   No results found  for this or any previous visit (from the past 2160 hour(s)).  Assessment/Plan: Environmental allergies Begin Flonase. Will switch OTC antihistamine to claritin.  Acute frontal sinusitis Rx Azithromycin.  Increase fluids.  Rest.  Saline nasal spray.  Probiotic.  Mucinex as directed.  Humidifier in bedroom. Flonase daily.  Call or return to clinic if symptoms are not improving.   Hyperlipidemia Tolerating statin. Will obtain previous records to review labs. Patient due for CPE. She will schedule.

## 2015-03-16 DIAGNOSIS — Z1231 Encounter for screening mammogram for malignant neoplasm of breast: Secondary | ICD-10-CM | POA: Diagnosis not present

## 2015-03-16 DIAGNOSIS — Z01419 Encounter for gynecological examination (general) (routine) without abnormal findings: Secondary | ICD-10-CM | POA: Diagnosis not present

## 2015-03-16 DIAGNOSIS — Z130 Encounter for screening for diseases of the blood and blood-forming organs and certain disorders involving the immune mechanism: Secondary | ICD-10-CM | POA: Diagnosis not present

## 2015-03-16 DIAGNOSIS — Z1389 Encounter for screening for other disorder: Secondary | ICD-10-CM | POA: Diagnosis not present

## 2015-03-16 LAB — HM MAMMOGRAPHY: HM MAMMO: NORMAL

## 2015-03-23 DIAGNOSIS — J0110 Acute frontal sinusitis, unspecified: Secondary | ICD-10-CM | POA: Insufficient documentation

## 2015-03-23 DIAGNOSIS — E785 Hyperlipidemia, unspecified: Secondary | ICD-10-CM | POA: Insufficient documentation

## 2015-03-23 DIAGNOSIS — Z9109 Other allergy status, other than to drugs and biological substances: Secondary | ICD-10-CM | POA: Insufficient documentation

## 2015-03-23 NOTE — Assessment & Plan Note (Signed)
Begin Flonase. Will switch OTC antihistamine to claritin.

## 2015-03-23 NOTE — Assessment & Plan Note (Signed)
Rx Azithromycin.  Increase fluids.  Rest.  Saline nasal spray.  Probiotic.  Mucinex as directed.  Humidifier in bedroom. Flonase daily.  Call or return to clinic if symptoms are not improving.

## 2015-03-23 NOTE — Assessment & Plan Note (Signed)
Tolerating statin. Will obtain previous records to review labs. Patient due for CPE. She will schedule.

## 2015-03-25 ENCOUNTER — Telehealth: Payer: Self-pay | Admitting: Physician Assistant

## 2015-03-25 MED ORDER — DOXYCYCLINE HYCLATE 100 MG PO CAPS
100.0000 mg | ORAL_CAPSULE | Freq: Two times a day (BID) | ORAL | Status: DC
Start: 2015-03-25 — End: 2015-04-05

## 2015-03-25 MED FILL — DOXYCYCLINE HYC 100 MG CAP: 100 | 10 days supply | Qty: 20 | Fill #0

## 2015-03-25 NOTE — Telephone Encounter (Signed)
Called and spoke with the pt and informed her of the note below.  Pt verbalized understanding and agreed.//AB/CMA 

## 2015-03-25 NOTE — Telephone Encounter (Signed)
Pt is still nasaly, she is still having drainage with a cough, she is still congested mostly on the right side, ears are not bothering her. Drainage is mostly clear except in the morning. Constantly clearing throat. She completed the zpack and had some pressure relief on the left side but otherwise feeling the same as before. Please advise.  Pharmacy: [facility]

## 2015-03-25 NOTE — Telephone Encounter (Signed)
Have sent in Rx doxycycline to take as directed. Continue rest, hydration, Flonase and supportive measures.

## 2015-04-05 ENCOUNTER — Encounter: Payer: Self-pay | Admitting: *Deleted

## 2015-04-05 ENCOUNTER — Telehealth: Payer: Self-pay | Admitting: *Deleted

## 2015-04-05 ENCOUNTER — Ambulatory Visit: Payer: Managed Care, Other (non HMO) | Admitting: Physician Assistant

## 2015-04-05 DIAGNOSIS — H9041 Sensorineural hearing loss, unilateral, right ear, with unrestricted hearing on the contralateral side: Secondary | ICD-10-CM | POA: Diagnosis not present

## 2015-04-05 NOTE — Telephone Encounter (Signed)
Pre-Visit Call completed with patient and chart updated.   Pre-Visit Info documented in Specialty Comments under [employer].    

## 2015-04-06 ENCOUNTER — Ambulatory Visit (INDEPENDENT_AMBULATORY_CARE_PROVIDER_SITE_OTHER): Payer: PPO | Admitting: Physician Assistant

## 2015-04-06 ENCOUNTER — Encounter: Payer: Self-pay | Admitting: Physician Assistant

## 2015-04-06 VITALS — BP 130/84 | HR 67 | Temp 97.7°F | Ht 66.75 in | Wt 165.4 lb

## 2015-04-06 DIAGNOSIS — Z0000 Encounter for general adult medical examination without abnormal findings: Secondary | ICD-10-CM

## 2015-04-06 DIAGNOSIS — M8580 Other specified disorders of bone density and structure, unspecified site: Secondary | ICD-10-CM | POA: Diagnosis not present

## 2015-04-06 DIAGNOSIS — E785 Hyperlipidemia, unspecified: Secondary | ICD-10-CM | POA: Diagnosis not present

## 2015-04-06 DIAGNOSIS — Z23 Encounter for immunization: Secondary | ICD-10-CM

## 2015-04-06 LAB — BASIC METABOLIC PANEL
BUN: 16 mg/dL (ref 6–23)
CHLORIDE: 105 meq/L (ref 96–112)
CO2: 30 meq/L (ref 19–32)
Calcium: 9.7 mg/dL (ref 8.4–10.5)
Creatinine, Ser: 0.76 mg/dL (ref 0.40–1.20)
GFR: 81 mL/min (ref >60.00)
Glucose, Bld: 103 mg/dL — ABNORMAL HIGH (ref 70–99)
POTASSIUM: 3.8 meq/L (ref 3.5–5.1)
SODIUM: 141 meq/L (ref 135–145)

## 2015-04-06 LAB — HEPATIC FUNCTION PANEL
ALK PHOS: 83 U/L (ref 39–117)
ALT: 34 U/L (ref 0–35)
AST: 27 U/L (ref 0–37)
Albumin: 4.3 g/dL (ref 3.5–5.2)
BILIRUBIN DIRECT: 0.4 mg/dL — AB (ref 0.0–0.3)
TOTAL PROTEIN: 7.7 g/dL (ref 6.0–8.3)
Total Bilirubin: 2.2 mg/dL — ABNORMAL HIGH (ref 0.2–1.2)

## 2015-04-06 LAB — LIPID PANEL
CHOL/HDL RATIO: 3
Cholesterol: 148 mg/dL (ref 0–200)
HDL: 50.5 mg/dL (ref >39.00)
LDL CALC: 60 mg/dL (ref 0–99)
NONHDL: 97.66
Triglycerides: 189 mg/dL — ABNORMAL HIGH (ref 0.0–149.0)
VLDL: 37.8 mg/dL (ref 0.0–40.0)

## 2015-04-06 MED ORDER — ZOSTER VACCINE LIVE 19400 UNT/0.65ML ~~LOC~~ SOLR: 0.6500 mL | Freq: Once | SUBCUTANEOUS | Status: DC

## 2015-04-06 NOTE — Progress Notes (Signed)
Pre visit review using our clinic review tool, if applicable. No additional management support is needed unless otherwise documented below in the visit note. 

## 2015-04-06 NOTE — Patient Instructions (Signed)
Please go to the lab for blood work.  I will call you with your results. If your blood work is normal we will follow-up yearly for physicals.  If anything is abnormal we will treat accordingly.  I will have your results faxed to Dr. Jenell Milliner (ENT). You will be contacted for a bone density scan.  Follow-up in 6 months unless we tell you otherwise when calling with results.  Preventive Care for Adults, Female A healthy lifestyle and preventive care can promote health and wellness. Preventive health guidelines for women include the following key practices.  A routine yearly physical is a good way to check with your health care provider about your health and preventive screening. It is a chance to share any concerns and updates on your health and to receive a thorough exam.  Visit your dentist for a routine exam and preventive care every 6 months. Brush your teeth twice a day and floss once a day. Good oral hygiene prevents tooth decay and gum disease.  The frequency of eye exams is based on your age, health, family medical history, use of contact lenses, and other factors. Follow your health care provider's recommendations for frequency of eye exams.  Eat a healthy diet. Foods like vegetables, fruits, whole grains, low-fat dairy products, and lean protein foods contain the nutrients you need without too many calories. Decrease your intake of foods high in solid fats, added sugars, and salt. Eat the right amount of calories for you.Get information about a proper diet from your health care provider, if necessary.  Regular physical exercise is one of the most important things you can do for your health. Most adults should get at least 150 minutes of moderate-intensity exercise (any activity that increases your heart rate and causes you to sweat) each week. In addition, most adults need muscle-strengthening exercises on 2 or more days a week.  Maintain a healthy weight. The body mass index (BMI) is a  screening tool to identify possible weight problems. It provides an estimate of body fat based on height and weight. Your health care provider can find your BMI and can help you achieve or maintain a healthy weight.For adults 20 years and older:  A BMI below 18.5 is considered underweight.  A BMI of 18.5 to 24.9 is normal.  A BMI of 25 to 29.9 is considered overweight.  A BMI of 30 and above is considered obese.  Maintain normal blood lipids and cholesterol levels by exercising and minimizing your intake of saturated fat. Eat a balanced diet with plenty of fruit and vegetables. Blood tests for lipids and cholesterol should begin at age 35 and be repeated every 5 years. If your lipid or cholesterol levels are high, you are over 50, or you are at high risk for heart disease, you may need your cholesterol levels checked more frequently.Ongoing high lipid and cholesterol levels should be treated with medicines if diet and exercise are not working.  If you smoke, find out from your health care provider how to quit. If you do not use tobacco, do not start.  Lung cancer screening is recommended for adults aged 43-80 years who are at high risk for developing lung cancer because of a history of smoking. A yearly low-dose CT scan of the lungs is recommended for people who have at least a 30-pack-year history of smoking and are a current smoker or have quit within the past 15 years. A pack year of smoking is smoking an average of 1 pack  of cigarettes a day for 1 year (for example: 1 pack a day for 30 years or 2 packs a day for 15 years). Yearly screening should continue until the smoker has stopped smoking for at least 15 years. Yearly screening should be stopped for people who develop a health problem that would prevent them from having lung cancer treatment.  If you are pregnant, do not drink alcohol. If you are breastfeeding, be very cautious about drinking alcohol. If you are not pregnant and choose to  drink alcohol, do not have more than 1 drink per day. One drink is considered to be 12 ounces (355 mL) of beer, 5 ounces (148 mL) of wine, or 1.5 ounces (44 mL) of liquor.  Avoid use of street drugs. Do not share needles with anyone. Ask for help if you need support or instructions about stopping the use of drugs.  High blood pressure causes heart disease and increases the risk of stroke. Your blood pressure should be checked at least every 1 to 2 years. Ongoing high blood pressure should be treated with medicines if weight loss and exercise do not work.  If you are 28-82 years old, ask your health care provider if you should take aspirin to prevent strokes.  Diabetes screening is done by taking a blood sample to check your blood glucose level after you have not eaten for a certain period of time (fasting). If you are not overweight and you do not have risk factors for diabetes, you should be screened once every 3 years starting at age 77. If you are overweight or obese and you are 61-85 years of age, you should be screened for diabetes every year as part of your cardiovascular risk assessment.  Breast cancer screening is essential preventive care for women. You should practice "breast self-awareness." This means understanding the normal appearance and feel of your breasts and may include breast self-examination. Any changes detected, no matter how small, should be reported to a health care provider. Women in their 20s and 30s should have a clinical breast exam (CBE) by a health care provider as part of a regular health exam every 1 to 3 years. After age 75, women should have a CBE every year. Starting at age 61, women should consider having a mammogram (breast X-ray test) every year. Women who have a family history of breast cancer should talk to their health care provider about genetic screening. Women at a high risk of breast cancer should talk to their health care providers about having an MRI and a  mammogram every year.  Breast cancer gene (BRCA)-related cancer risk assessment is recommended for women who have family members with BRCA-related cancers. BRCA-related cancers include breast, ovarian, tubal, and peritoneal cancers. Having family members with these cancers may be associated with an increased risk for harmful changes (mutations) in the breast cancer genes BRCA1 and BRCA2. Results of the assessment will determine the need for genetic counseling and BRCA1 and BRCA2 testing.  Your health care provider may recommend that you be screened regularly for cancer of the pelvic organs (ovaries, uterus, and vagina). This screening involves a pelvic examination, including checking for microscopic changes to the surface of your cervix (Pap test). You may be encouraged to have this screening done every 3 years, beginning at age 8.  For women ages 74-65, health care providers may recommend pelvic exams and Pap testing every 3 years, or they may recommend the Pap and pelvic exam, combined with testing for human papilloma  virus (HPV), every 5 years. Some types of HPV increase your risk of cervical cancer. Testing for HPV may also be done on women of any age with unclear Pap test results.  Other health care providers may not recommend any screening for nonpregnant women who are considered low risk for pelvic cancer and who do not have symptoms. Ask your health care provider if a screening pelvic exam is right for you.  If you have had past treatment for cervical cancer or a condition that could lead to cancer, you need Pap tests and screening for cancer for at least 20 years after your treatment. If Pap tests have been discontinued, your risk factors (such as having a new sexual partner) need to be reassessed to determine if screening should resume. Some women have medical problems that increase the chance of getting cervical cancer. In these cases, your health care provider may recommend more frequent  screening and Pap tests.  Colorectal cancer can be detected and often prevented. Most routine colorectal cancer screening begins at the age of 58 years and continues through age 39 years. However, your health care provider may recommend screening at an earlier age if you have risk factors for colon cancer. On a yearly basis, your health care provider may provide home test kits to check for hidden blood in the stool. Use of a small camera at the end of a tube, to directly examine the colon (sigmoidoscopy or colonoscopy), can detect the earliest forms of colorectal cancer. Talk to your health care provider about this at age 66, when routine screening begins. Direct exam of the colon should be repeated every 5-10 years through age 87 years, unless early forms of precancerous polyps or small growths are found.  People who are at an increased risk for hepatitis B should be screened for this virus. You are considered at high risk for hepatitis B if:  You were born in a country where hepatitis B occurs often. Talk with your health care provider about which countries are considered high risk.  Your parents were born in a high-risk country and you have not received a shot to protect against hepatitis B (hepatitis B vaccine).  You have HIV or AIDS.  You use needles to inject street drugs.  You live with, or have sex with, someone who has hepatitis B.  You get hemodialysis treatment.  You take certain medicines for conditions like cancer, organ transplantation, and autoimmune conditions.  Hepatitis C blood testing is recommended for all people born from [birth year] through 1965 and any individual with known risks for hepatitis C.  Practice safe sex. Use condoms and avoid high-risk sexual practices to reduce the spread of sexually transmitted infections (STIs). STIs include gonorrhea, chlamydia, syphilis, trichomonas, herpes, HPV, and human immunodeficiency virus (HIV). Herpes, HIV, and HPV are viral illnesses  that have no cure. They can result in disability, cancer, and death.  You should be screened for sexually transmitted illnesses (STIs) including gonorrhea and chlamydia if:  You are sexually active and are younger than 24 years.  You are older than 24 years and your health care provider tells you that you are at risk for this type of infection.  Your sexual activity has changed since you were last screened and you are at an increased risk for chlamydia or gonorrhea. Ask your health care provider if you are at risk.  If you are at risk of being infected with HIV, it is recommended that you take a prescription medicine daily  to prevent HIV infection. This is called preexposure prophylaxis (PrEP). You are considered at risk if:  You are sexually active and do not regularly use condoms or know the HIV status of your partner(s).  You take drugs by injection.  You are sexually active with a partner who has HIV.  Talk with your health care provider about whether you are at high risk of being infected with HIV. If you choose to begin PrEP, you should first be tested for HIV. You should then be tested every 3 months for as long as you are taking PrEP.  Osteoporosis is a disease in which the bones lose minerals and strength with aging. This can result in serious bone fractures or breaks. The risk of osteoporosis can be identified using a bone density scan. Women ages 40 years and over and women at risk for fractures or osteoporosis should discuss screening with their health care providers. Ask your health care provider whether you should take a calcium supplement or vitamin D to reduce the rate of osteoporosis.  Menopause can be associated with physical symptoms and risks. Hormone replacement therapy is available to decrease symptoms and risks. You should talk to your health care provider about whether hormone replacement therapy is right for you.  Use sunscreen. Apply sunscreen liberally and  repeatedly throughout the day. You should seek shade when your shadow is shorter than you. Protect yourself by wearing long sleeves, pants, a wide-brimmed hat, and sunglasses year round, whenever you are outdoors.  Once a month, do a whole body skin exam, using a mirror to look at the skin on your back. Tell your health care provider of new moles, moles that have irregular borders, moles that are larger than a pencil eraser, or moles that have changed in shape or color.  Stay current with required vaccines (immunizations).  Influenza vaccine. All adults should be immunized every year.  Tetanus, diphtheria, and acellular pertussis (Td, Tdap) vaccine. Pregnant women should receive 1 dose of Tdap vaccine during each pregnancy. The dose should be obtained regardless of the length of time since the last dose. Immunization is preferred during the 27th-36th week of gestation. An adult who has not previously received Tdap or who does not know her vaccine status should receive 1 dose of Tdap. This initial dose should be followed by tetanus and diphtheria toxoids (Td) booster doses every 10 years. Adults with an unknown or incomplete history of completing a 3-dose immunization series with Td-containing vaccines should begin or complete a primary immunization series including a Tdap dose. Adults should receive a Td booster every 10 years.  Varicella vaccine. An adult without evidence of immunity to varicella should receive 2 doses or a second dose if she has previously received 1 dose. Pregnant females who do not have evidence of immunity should receive the first dose after pregnancy. This first dose should be obtained before leaving the health care facility. The second dose should be obtained 4-8 weeks after the first dose.  Human papillomavirus (HPV) vaccine. Females aged 13-26 years who have not received the vaccine previously should obtain the 3-dose series. The vaccine is not recommended for use in pregnant  females. However, pregnancy testing is not needed before receiving a dose. If a female is found to be pregnant after receiving a dose, no treatment is needed. In that case, the remaining doses should be delayed until after the pregnancy. Immunization is recommended for any person with an immunocompromised condition through the age of 84 years if  she did not get any or all doses earlier. During the 3-dose series, the second dose should be obtained 4-8 weeks after the first dose. The third dose should be obtained 24 weeks after the first dose and 16 weeks after the second dose.  Zoster vaccine. One dose is recommended for adults aged 94 years or older unless certain conditions are present.  Measles, mumps, and rubella (MMR) vaccine. Adults born before [birth year] generally are considered immune to measles and mumps. Adults born in [birth year] or later should have 1 or more doses of MMR vaccine unless there is a contraindication to the vaccine or there is laboratory evidence of immunity to each of the three diseases. A routine second dose of MMR vaccine should be obtained at least 28 days after the first dose for students attending postsecondary schools, health care workers, or international travelers. People who received inactivated measles vaccine or an unknown type of measles vaccine during 1963-1967 should receive 2 doses of MMR vaccine. People who received inactivated mumps vaccine or an unknown type of mumps vaccine before 1979 and are at high risk for mumps infection should consider immunization with 2 doses of MMR vaccine. For females of childbearing age, rubella immunity should be determined. If there is no evidence of immunity, females who are not pregnant should be vaccinated. If there is no evidence of immunity, females who are pregnant should delay immunization until after pregnancy. Unvaccinated health care workers born before [birth year] who lack laboratory evidence of measles, mumps, or rubella immunity or laboratory  confirmation of disease should consider measles and mumps immunization with 2 doses of MMR vaccine or rubella immunization with 1 dose of MMR vaccine.  Pneumococcal 13-valent conjugate (PCV13) vaccine. When indicated, a person who is uncertain of his immunization history and has no record of immunization should receive the PCV13 vaccine. All adults 59 years of age and older should receive this vaccine. An adult aged 74 years or older who has certain medical conditions and has not been previously immunized should receive 1 dose of PCV13 vaccine. This PCV13 should be followed with a dose of pneumococcal polysaccharide (PPSV23) vaccine. Adults who are at high risk for pneumococcal disease should obtain the PPSV23 vaccine at least 8 weeks after the dose of PCV13 vaccine. Adults older than 66 years of age who have normal immune system function should obtain the PPSV23 vaccine dose at least 1 year after the dose of PCV13 vaccine.  Pneumococcal polysaccharide (PPSV23) vaccine. When PCV13 is also indicated, PCV13 should be obtained first. All adults aged 59 years and older should be immunized. An adult younger than age 40 years who has certain medical conditions should be immunized. Any person who resides in a nursing home or long-term care facility should be immunized. An adult smoker should be immunized. People with an immunocompromised condition and certain other conditions should receive both PCV13 and PPSV23 vaccines. People with human immunodeficiency virus (HIV) infection should be immunized as soon as possible after diagnosis. Immunization during chemotherapy or radiation therapy should be avoided. Routine use of PPSV23 vaccine is not recommended for American Indians, [place] Natives, or people younger than 65 years unless there are medical conditions that require PPSV23 vaccine. When indicated, people who have unknown immunization and have no record of immunization should receive PPSV23 vaccine. One-time  revaccination 5 years after the first dose of PPSV23 is recommended for people aged 19-64 years who have chronic kidney failure, nephrotic syndrome, asplenia, or immunocompromised conditions. People who received 1-2 doses of  PPSV23 before age 40 years should receive another dose of PPSV23 vaccine at age 68 years or later if at least 5 years have passed since the previous dose. Doses of PPSV23 are not needed for people immunized with PPSV23 at or after age 5 years.  Meningococcal vaccine. Adults with asplenia or persistent complement component deficiencies should receive 2 doses of quadrivalent meningococcal conjugate (MenACWY-D) vaccine. The doses should be obtained at least 2 months apart. Microbiologists working with certain meningococcal bacteria, [place] recruits, people at risk during an outbreak, and people who travel to or live in countries with a high rate of meningitis should be immunized. A first-year college student up through age 23 years who is living in a residence hall should receive a dose if she did not receive a dose on or after her 16th birthday. Adults who have certain high-risk conditions should receive one or more doses of vaccine.  Hepatitis A vaccine. Adults who wish to be protected from this disease, have certain high-risk conditions, work with hepatitis A-infected animals, work in hepatitis A research labs, or travel to or work in countries with a high rate of hepatitis A should be immunized. Adults who were previously unvaccinated and who anticipate close contact with an international adoptee during the first 60 days after arrival in the [place] from a country with a high rate of hepatitis A should be immunized.  Hepatitis B vaccine. Adults who wish to be protected from this disease, have certain high-risk conditions, may be exposed to blood or other infectious body fluids, are household contacts or sex partners of hepatitis B positive people, are clients or workers in  certain care facilities, or travel to or work in countries with a high rate of hepatitis B should be immunized.  Haemophilus influenzae type b (Hib) vaccine. A previously unvaccinated person with asplenia or sickle cell disease or having a scheduled splenectomy should receive 1 dose of Hib vaccine. Regardless of previous immunization, a recipient of a hematopoietic stem cell transplant should receive a 3-dose series 6-12 months after her successful transplant. Hib vaccine is not recommended for adults with HIV infection. Preventive Services / Frequency Ages 33 to 59 years  Blood pressure check.** / Every 3-5 years.  Lipid and cholesterol check.** / Every 5 years beginning at age 55.  Clinical breast exam.** / Every 3 years for women in their 44s and 27s.  BRCA-related cancer risk assessment.** / For women who have family members with a BRCA-related cancer (breast, ovarian, tubal, or peritoneal cancers).  Pap test.** / Every 2 years from ages 99 through 39. Every 3 years starting at age 61 through age 31 or 58 with a history of 3 consecutive normal Pap tests.  HPV screening.** / Every 3 years from ages 77 through ages 63 to 75 with a history of 3 consecutive normal Pap tests.  Hepatitis C blood test.** / For any individual with known risks for hepatitis C.  Skin self-exam. / Monthly.  Influenza vaccine. / Every year.  Tetanus, diphtheria, and acellular pertussis (Tdap, Td) vaccine.** / Consult your health care provider. Pregnant women should receive 1 dose of Tdap vaccine during each pregnancy. 1 dose of Td every 10 years.  Varicella vaccine.** / Consult your health care provider. Pregnant females who do not have evidence of immunity should receive the first dose after pregnancy.  HPV vaccine. / 3 doses over 6 months, if 46 and younger. The vaccine is not recommended for use in pregnant females. However, pregnancy testing  is not needed before receiving a dose.  Measles, mumps, rubella  (MMR) vaccine.** / You need at least 1 dose of MMR if you were born in 1957 or later. You may also need a 2nd dose. For females of childbearing age, rubella immunity should be determined. If there is no evidence of immunity, females who are not pregnant should be vaccinated. If there is no evidence of immunity, females who are pregnant should delay immunization until after pregnancy.  Pneumococcal 13-valent conjugate (PCV13) vaccine.** / Consult your health care provider.  Pneumococcal polysaccharide (PPSV23) vaccine.** / 1 to 2 doses if you smoke cigarettes or if you have certain conditions.  Meningococcal vaccine.** / 1 dose if you are age 67 to 49 years and a Market researcher living in a residence hall, or have one of several medical conditions, you need to get vaccinated against meningococcal disease. You may also need additional booster doses.  Hepatitis A vaccine.** / Consult your health care provider.  Hepatitis B vaccine.** / Consult your health care provider.  Haemophilus influenzae type b (Hib) vaccine.** / Consult your health care provider. Ages 31 to 57 years  Blood pressure check.** / Every year.  Lipid and cholesterol check.** / Every 5 years beginning at age 28 years.  Lung cancer screening. / Every year if you are aged 43-80 years and have a 30-pack-year history of smoking and currently smoke or have quit within the past 15 years. Yearly screening is stopped once you have quit smoking for at least 15 years or develop a health problem that would prevent you from having lung cancer treatment.  Clinical breast exam.** / Every year after age 51 years.  BRCA-related cancer risk assessment.** / For women who have family members with a BRCA-related cancer (breast, ovarian, tubal, or peritoneal cancers).  Mammogram.** / Every year beginning at age 32 years and continuing for as long as you are in good health. Consult with your health care provider.  Pap test.** / Every 3  years starting at age 72 years through age 16 or 69 years with a history of 3 consecutive normal Pap tests.  HPV screening.** / Every 3 years from ages 22 years through ages 67 to 19 years with a history of 3 consecutive normal Pap tests.  Fecal occult blood test (FOBT) of stool. / Every year beginning at age 64 years and continuing until age 64 years. You may not need to do this test if you get a colonoscopy every 10 years.  Flexible sigmoidoscopy or colonoscopy.** / Every 5 years for a flexible sigmoidoscopy or every 10 years for a colonoscopy beginning at age 73 years and continuing until age 81 years.  Hepatitis C blood test.** / For all people born from [birth year] through 1966 and any individual with known risks for hepatitis C.  Skin self-exam. / Monthly.  Influenza vaccine. / Every year.  Tetanus, diphtheria, and acellular pertussis (Tdap/Td) vaccine.** / Consult your health care provider. Pregnant women should receive 1 dose of Tdap vaccine during each pregnancy. 1 dose of Td every 10 years.  Varicella vaccine.** / Consult your health care provider. Pregnant females who do not have evidence of immunity should receive the first dose after pregnancy.  Zoster vaccine.** / 1 dose for adults aged 24 years or older.  Measles, mumps, rubella (MMR) vaccine.** / You need at least 1 dose of MMR if you were born in 1957 or later. You may also need a second dose. For females of childbearing age, rubella  immunity should be determined. If there is no evidence of immunity, females who are not pregnant should be vaccinated. If there is no evidence of immunity, females who are pregnant should delay immunization until after pregnancy.  Pneumococcal 13-valent conjugate (PCV13) vaccine.** / Consult your health care provider.  Pneumococcal polysaccharide (PPSV23) vaccine.** / 1 to 2 doses if you smoke cigarettes or if you have certain conditions.  Meningococcal vaccine.** / Consult your health care  provider.  Hepatitis A vaccine.** / Consult your health care provider.  Hepatitis B vaccine.** / Consult your health care provider.  Haemophilus influenzae type b (Hib) vaccine.** / Consult your health care provider. Ages 66 years and over  Blood pressure check.** / Every year.  Lipid and cholesterol check.** / Every 5 years beginning at age 26 years.  Lung cancer screening. / Every year if you are aged 7-80 years and have a 30-pack-year history of smoking and currently smoke or have quit within the past 15 years. Yearly screening is stopped once you have quit smoking for at least 15 years or develop a health problem that would prevent you from having lung cancer treatment.  Clinical breast exam.** / Every year after age 19 years.  BRCA-related cancer risk assessment.** / For women who have family members with a BRCA-related cancer (breast, ovarian, tubal, or peritoneal cancers).  Mammogram.** / Every year beginning at age 68 years and continuing for as long as you are in good health. Consult with your health care provider.  Pap test.** / Every 3 years starting at age 36 years through age 10 or 7 years with 3 consecutive normal Pap tests. Testing can be stopped between 65 and 70 years with 3 consecutive normal Pap tests and no abnormal Pap or HPV tests in the past 10 years.  HPV screening.** / Every 3 years from ages 17 years through ages 23 or 48 years with a history of 3 consecutive normal Pap tests. Testing can be stopped between 65 and 70 years with 3 consecutive normal Pap tests and no abnormal Pap or HPV tests in the past 10 years.  Fecal occult blood test (FOBT) of stool. / Every year beginning at age 38 years and continuing until age 89 years. You may not need to do this test if you get a colonoscopy every 10 years.  Flexible sigmoidoscopy or colonoscopy.** / Every 5 years for a flexible sigmoidoscopy or every 10 years for a colonoscopy beginning at age 36 years and continuing  until age 11 years.  Hepatitis C blood test.** / For all people born from [birth year] through 1966 and any individual with known risks for hepatitis C.  Osteoporosis screening.** / A one-time screening for women ages 62 years and over and women at risk for fractures or osteoporosis.  Skin self-exam. / Monthly.  Influenza vaccine. / Every year.  Tetanus, diphtheria, and acellular pertussis (Tdap/Td) vaccine.** / 1 dose of Td every 10 years.  Varicella vaccine.** / Consult your health care provider.  Zoster vaccine.** / 1 dose for adults aged 1 years or older.  Pneumococcal 13-valent conjugate (PCV13) vaccine.** / Consult your health care provider.  Pneumococcal polysaccharide (PPSV23) vaccine.** / 1 dose for all adults aged 74 years and older.  Meningococcal vaccine.** / Consult your health care provider.  Hepatitis A vaccine.** / Consult your health care provider.  Hepatitis B vaccine.** / Consult your health care provider.  Haemophilus influenzae type b (Hib) vaccine.** / Consult your health care provider. ** Family history and personal history  of risk and conditions may change your health care provider's recommendations.   This information is not intended to replace advice given to you by your health care provider. Make sure you discuss any questions you have with your health care provider.   Document Released: 03/06/2001 Document Revised: 01/29/2014 Document Reviewed: 06/05/2010 Elsevier Interactive Patient Education [employer].

## 2015-04-06 NOTE — Progress Notes (Signed)
Subjective:    [name] is a 65 y.o. female who presents for a welcome to Medicare exam. Patient denies acute concerns.  Chronic medical issues include GERD and Hyperlipidemia. Is taking medications as directed without side effects. Has history of osteopenia, taking calcium-D supplement daily.  Is following up with ENT for hearing loss -- has MRI scheduled. Needs BUN and Creatinine checked today to be faxed to specialist.   Patient has checked with insurance regarding the shingles vaccine. Has had chicken pox as a child. Endorses insurance will pay 45.00 at a pharmacy.   Cardiac risk factors: advanced age (older than 81 for men, 67 for women) and dyslipidemia.  Activities of Daily Living  In your present state of health, do you have any difficulty performing the following activities?:  Preparing food and eating?: No Bathing yourself: No Getting dressed: No Using the toilet:No Moving around from place to place: No In the past year have you fallen or had a near fall?:No  Current exercise habits: Home exercise routine includes walking 0.5 hrs per day.   Dietary issues discussed: Body mass index is 26.11 kg/(m^2). Endorses well-balanced diet overall. Does like sweets from time-to-time  Depression Screen (Note: if answer to either of the following is "Yes", then a more complete depression screening is indicated)  Q1: Over the past two weeks, have you felt down, depressed or hopeless?no Q2: Over the past two weeks, have you felt little interest or pleasure in doing things? no   The following portions of the patient's history were reviewed and updated as appropriate: allergies, current medications, past family history, past medical history, past social history, past surgical history and problem list   . Review of Systems A comprehensive review of systems was negative.    Objective:     Vision by Snellen chart: right eye:20/20, left eye:20/30 Blood pressure 130/84, pulse 67,  temperature 97.7 F (36.5 C), temperature source Oral, height 5' 6.75" (1.695 m), weight 165 lb 6.4 oz (75.025 kg), SpO2 98 %. Body mass index is 26.11 kg/(m^2). General appearance: alert, cooperative, appears stated age and no distress Eyes: conjunctivae/corneas clear. PERRL, EOM's intact. Fundi benign. Ears: normal TM's and external ear canals both ears Nose: Nares normal. Septum midline. Mucosa normal. No drainage or sinus tenderness. Throat: lips, mucosa, and tongue normal; teeth and gums normal Lungs: clear to auscultation bilaterally Heart: regular rate and rhythm, S1, S2 normal, no murmur, click, rub or gallop Abdomen: soft, non-tender; bowel sounds normal; no masses,  no organomegaly Extremities: extremities normal, atraumatic, no cyanosis or edema Pulses: 2+ and symmetric Neurologic: Alert and oriented X 3, normal strength and tone. Normal symmetric reflexes. Normal coordination and gait    Assessment:     (1) Welcome to Medicare Exam   (2) Visit for Preventive Health Examination   (3) Hyperlipidemia    (4) Osteopenia   (5) Need for Vaccination against shingles.      Plan:     (1) During the course of the visit the patient was educated and counseled about appropriate screening and preventive services including:   Screening mammography  Screening Pap smear and pelvic exam   Bone densitometry screening  Nutrition counseling    (2) CPE performed without abnormal findings. Health Maintenance updated. Will obtain fasting labs.   (3) Will check fasting lipid panel today. Continue medications as directed.  (4) Order for repeat Bone Density placed. Continue calcium-D supplementation.  (5) Vaccine printed so patent can have immunization and pharmacy.  Patient  Instructions (the written plan) was given to the patient.

## 2015-04-12 ENCOUNTER — Telehealth: Payer: Self-pay | Admitting: Physician Assistant

## 2015-04-12 DIAGNOSIS — H903 Sensorineural hearing loss, bilateral: Secondary | ICD-10-CM | POA: Diagnosis not present

## 2015-04-12 DIAGNOSIS — H9041 Sensorineural hearing loss, unilateral, right ear, with unrestricted hearing on the contralateral side: Secondary | ICD-10-CM | POA: Diagnosis not present

## 2015-04-12 NOTE — Telephone Encounter (Signed)
Scheduled for shingles shot

## 2015-04-12 NOTE — Telephone Encounter (Signed)
Ok to schedule for Zostavax.

## 2015-04-12 NOTE — Telephone Encounter (Signed)
Caller name: Self  Can be reached: [phone]   Reason for call: Patient left message on VM asking to be scheduled for a Shingles shot. Plse adv

## 2015-04-18 DIAGNOSIS — H9190 Unspecified hearing loss, unspecified ear: Secondary | ICD-10-CM | POA: Diagnosis not present

## 2015-04-18 DIAGNOSIS — H9041 Sensorineural hearing loss, unilateral, right ear, with unrestricted hearing on the contralateral side: Secondary | ICD-10-CM | POA: Diagnosis not present

## 2015-04-18 DIAGNOSIS — H9191 Unspecified hearing loss, right ear: Secondary | ICD-10-CM | POA: Diagnosis not present

## 2015-04-18 DIAGNOSIS — H0520 Unspecified exophthalmos: Secondary | ICD-10-CM | POA: Diagnosis not present

## 2015-04-18 DIAGNOSIS — J342 Deviated nasal septum: Secondary | ICD-10-CM | POA: Diagnosis not present

## 2015-04-20 ENCOUNTER — Ambulatory Visit: Payer: PPO

## 2015-04-20 DIAGNOSIS — Z23 Encounter for immunization: Secondary | ICD-10-CM

## 2015-04-20 MED ORDER — ZOSTER VACCINE LIVE 19400 UNT/0.65ML ~~LOC~~ SOLR
0.6500 mL | Freq: Once | SUBCUTANEOUS | Status: AC
  Administered 2015-04-20: 19400 [IU] via SUBCUTANEOUS

## 2015-04-20 NOTE — Progress Notes (Signed)
Pre visit review using our clinic review tool, if applicable. No additional management support is needed unless otherwise documented below in the visit note.  Patient in for Zostavax Immunization per order from Brunetta Jeans PA..  Given IM Left deltoid. Patient tolerated well. Given Patient information sheet regarding immunization. Advised patient to call with any concerns or problems at injection site.

## 2015-04-25 DIAGNOSIS — H1132 Conjunctival hemorrhage, left eye: Secondary | ICD-10-CM | POA: Diagnosis not present

## 2015-04-25 MED FILL — VIGAMOX 0.5% EYE DROPS: 0.5 | 10 days supply | Qty: 3 | Fill #0

## 2015-05-04 ENCOUNTER — Other Ambulatory Visit: Payer: Self-pay | Admitting: Physician Assistant

## 2015-05-04 ENCOUNTER — Ambulatory Visit (HOSPITAL_BASED_OUTPATIENT_CLINIC_OR_DEPARTMENT_OTHER)
Admission: RE | Admit: 2015-05-04 | Discharge: 2015-05-04 | Disposition: A | Discharge status: Home / Self Care | Payer: PPO | Source: Ambulatory Visit | Attending: Physician Assistant | Admitting: Physician Assistant

## 2015-05-04 ENCOUNTER — Ambulatory Visit (INDEPENDENT_AMBULATORY_CARE_PROVIDER_SITE_OTHER): Payer: PPO | Admitting: Family Medicine

## 2015-05-04 ENCOUNTER — Ambulatory Visit (HOSPITAL_BASED_OUTPATIENT_CLINIC_OR_DEPARTMENT_OTHER)
Admission: RE | Admit: 2015-05-04 | Discharge: 2015-05-04 | Disposition: A | Discharge status: Home / Self Care | Payer: PPO | Source: Ambulatory Visit | Attending: Family Medicine | Admitting: Family Medicine

## 2015-05-04 ENCOUNTER — Encounter: Payer: Self-pay | Admitting: Family Medicine

## 2015-05-04 VITALS — BP 130/88 | HR 68 | Temp 97.8°F | Resp 18 | Ht 66.75 in | Wt 166.4 lb

## 2015-05-04 DIAGNOSIS — M810 Age-related osteoporosis without current pathological fracture: Secondary | ICD-10-CM | POA: Insufficient documentation

## 2015-05-04 DIAGNOSIS — M25551 Pain in right hip: Secondary | ICD-10-CM | POA: Insufficient documentation

## 2015-05-04 DIAGNOSIS — M1611 Unilateral primary osteoarthritis, right hip: Secondary | ICD-10-CM | POA: Diagnosis not present

## 2015-05-04 DIAGNOSIS — Z780 Asymptomatic menopausal state: Secondary | ICD-10-CM | POA: Insufficient documentation

## 2015-05-04 DIAGNOSIS — M8580 Other specified disorders of bone density and structure, unspecified site: Secondary | ICD-10-CM

## 2015-05-04 MED ORDER — TIZANIDINE HCL 2 MG PO CAPS: 2.0000 mg | ORAL_CAPSULE | Freq: Three times a day (TID) | ORAL | Status: DC | PRN

## 2015-05-04 MED ORDER — CYCLOBENZAPRINE HCL 10 MG PO TABS: 10.0000 mg | ORAL_TABLET | Freq: Two times a day (BID) | ORAL | Status: DC | PRN

## 2015-05-04 MED ORDER — MELOXICAM 15 MG PO TABS: 15.0000 mg | ORAL_TABLET | Freq: Every day | ORAL | Status: DC

## 2015-05-04 MED FILL — tiZANidine HCL 2 MG TABS: 2 | 10 days supply | Qty: 30 | Fill #0

## 2015-05-04 MED FILL — MELOXICAM 15 MG TABLET: 15 | 30 days supply | Qty: 30 | Fill #0

## 2015-05-04 NOTE — Progress Notes (Signed)
Pre visit review using our clinic review tool, if applicable. No additional management support is needed unless otherwise documented below in the visit note. 

## 2015-05-04 NOTE — Patient Instructions (Signed)
Please go downstairs for an x-ray, then you may go home.  I will be in touch with your report later on today Assuming that your x-rays look ok I think this may be an IT band injury.    Try the mobic once a day (NSAID for inflammation and pain) and the flexeril up to twice a day as needed (muscle relaxer- will make you sleepy!) Continue to use ice/ biofreeze as needed  If your x-rays are normal and you do not improve we will have you see a specialist Take it easy as far as your activity level

## 2015-05-04 NOTE — Progress Notes (Signed)
[name] [address] [place], [place] [address] [phone]  Date:  05/04/2015   Name:  [name]   DOB:  1950-08-02   MRN:  [id number]  PCP:  Leeanne Rio, PA-C    Chief Complaint: Hip Pain   History of Present Illness:  [name] is a 65 y.o. very pleasant female patient who presents with the following:  History of high cholesterol, abd hyst.  Here today with complaint of back pain About 4-6 weeks ago she noted some back pain. She had a massage and the therapist noted that she might have SI joint issues. She got better but over the last couple of days she has noted a lot of lateral right hip pain.  The pain can radiate into her anterior thigh.  The pain does not radiate into the back of her leg  She is using biofreeze which does help some No numbness or weakness of the leg She does have some pain with walking and especially sitting- cannot sit on that side No falls or injury She did start a walking program a couple of months ago- she slowed down on this recently due to time constraints.  Her last walk was a week ago or so and "felt great" after  No belly pain, no NV, no urinary sx  She has tried heat, cold and biofreeze so far She did take ibuprofen last night- did not help much     Patient Active Problem List   Diagnosis Date Noted  . Routine history and physical examination of adult 04/06/2015  . Need for shingles vaccine 04/06/2015  . Environmental allergies 03/23/2015  . Hyperlipidemia 03/23/2015  . Gastro-esophageal reflux disease without esophagitis 02/04/2014  . 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 Osteopenia 07/20/2013    Past Medical History  Diagnosis Date  . High cholesterol   . Environmental allergies   . History of chicken pox   . Hearing loss     Past Surgical History  Procedure Laterality Date  . Knee arthroscopy    . Abdominal  hysterectomy    . Medial partial knee replacement  07-1998,08-1998  . Eye surgery    . Foot surgery      Social History  Substance Use Topics  . Smoking status: Never Smoker   . Smokeless tobacco: Never Used  . Alcohol Use: No    Family History  Problem Relation Age of Onset  . COPD Mother   . Heart disease Mother   . Hypertension Father   . Crohn's disease Sister   . Hypertension Brother   . Heart disease Brother   . Kidney disease Son   . Tuberculosis Maternal Grandmother   . Leukemia Maternal Grandfather     Allergies  Allergen Reactions  . Penicillins Anaphylaxis and Rash  . Darvon [Propoxyphene] Swelling  . Vicodin [Hydrocodone-Acetaminophen] Other (See Comments)    Unknown reaction  . Methocarbamol Rash    Medication list has been reviewed and updated.  Current Outpatient Prescriptions on File Prior to Visit  Medication Sig Dispense Refill  . Ascorbic Acid (VITAMIN C) 1000 MG tablet Take 1,000 mg by mouth daily.     Marland Kitchen atorvastatin (LIPITOR) 40 MG tablet Take 40 mg by mouth daily.    . calcium-vitamin D (CALCIUM 500+D HIGH POTENCY) 500-400 MG-UNIT tablet Take 2 tablets by mouth daily.     . fluticasone (FLONASE) 50 MCG/ACT nasal spray Place  2 sprays into both nostrils daily.    . Loratadine (CLARITIN PO) Take by mouth daily.    Marland Kitchen loratadine (SM LORATADINE) 5 MG/5ML syrup Take 1 tablet by mouth daily.    . Multiple Vitamin (MULTI-VITAMINS) TABS Take 1 tablet by mouth daily.    . Omega-3 Fatty Acids (FISH OIL) 1000 MG CAPS Take 1 capsule by mouth daily.    Marland Kitchen zoster vaccine live, PF, (ZOSTAVAX) [address] UNT/0.65ML injection Inject 19,400 Units into the skin once. 1 each 0   No current facility-administered medications on file prior to visit.    Review of Systems:  As per HPI- otherwise negative.   Physical Examination: Filed Vitals:   05/04/15 1033  BP: 130/88  Pulse: 68  Temp: 97.8 F (36.6 C)  Resp: 18   Filed Vitals:   05/04/15 1033  Height: 5'  6.75" (1.695 m)  Weight: 166 lb 6.4 oz (75.479 kg)   Body mass index is 26.27 kg/(m^2). Ideal Body Weight: Weight in (lb) to have BMI = 25: 158.1  GEN: WDWN, NAD, Non-toxic, A & O x 3, looks well HEENT: Atraumatic, Normocephalic. Neck supple. No masses, No LAD. Ears and Nose: No external deformity. CV: RRR, No M/G/R. No JVD. No thrill. No extra heart sounds. PULM: CTA B, no wheezes, crackles, rhonchi. No retractions. No resp. distress. No accessory muscle use. ABD: S, NT, ND. No rebound. No HSM.  Benign belly EXTR: No c/c/e NEURO Normal gait.  Does not want to sit on right hip however PSYCH: Normally interactive. Conversant. Not depressed or anxious appearing.  Calm demeanor.  RIGHT hip:   Tenderness to palpation over the lateral hip but this is inferior to the greater trochanter.  No redness or swelling. No pain with ROM of the hip or log roll.  Normal strength of both legs  Xray right hip:  CLINICAL DATA: Right hip and groin pain for 1 week, no injury  EXAM: DG HIP (WITH OR WITHOUT PELVIS) 2-3V RIGHT  COMPARISON: None.  FINDINGS: There is only mild degenerative joint disease of the hips with slight loss of joint space and minimal spurring. No acute abnormality is seen. The pelvic rami are intact. There are degenerative changes involving the symphysis pubis. The SI joints appear corticated.  IMPRESSION: Mild degenerative change in the hips. No acute abnormality.   Assessment and Plan: Right hip pain - Plan: DG HIP UNILAT W OR W/O PELVIS 2-3 VIEWS RIGHT, meloxicam (MOBIC) 15 MG tablet, tizanidine (ZANAFLEX) 2 MG capsule, DISCONTINUED: cyclobenzaprine (FLEXERIL) 10 MG tablet  Here today with right hip pain Plain film as above.  Considered injection for trochanteric bursitis but pain is a bit low for this dx. Will try mobic and zanaflex (canceled flexeril as zanaflex is preferred by insurance She will let me know if this is not helpful by the end of the week/ early next  week  See patient instructions for more details.     Signed Lamar Blinks, MD

## 2015-05-09 ENCOUNTER — Encounter: Payer: Self-pay | Admitting: Physician Assistant

## 2015-05-10 ENCOUNTER — Other Ambulatory Visit: Payer: Self-pay

## 2015-05-10 ENCOUNTER — Encounter: Payer: Self-pay | Admitting: Physician Assistant

## 2015-05-10 MED ORDER — ALENDRONATE SODIUM 70 MG PO TABS: 70.0000 mg | ORAL_TABLET | ORAL | Status: DC

## 2015-05-10 MED FILL — ALENDRONATE NA 70 MG TAB: 70 | 28 days supply | Qty: 4 | Fill #0

## 2015-05-10 MED FILL — CLINDAMYCIN HCL 300 MG CAPS: 300 | 2 days supply | Qty: 6 | Fill #0

## 2015-05-30 ENCOUNTER — Other Ambulatory Visit: Payer: PPO

## 2015-06-06 MED FILL — ALENDRONATE NA 70 MG TAB: 70 | 28 days supply | Qty: 4 | Fill #1

## 2015-06-24 ENCOUNTER — Ambulatory Visit (INDEPENDENT_AMBULATORY_CARE_PROVIDER_SITE_OTHER): Payer: PPO | Admitting: Physician Assistant

## 2015-06-24 ENCOUNTER — Encounter: Payer: Self-pay | Admitting: Physician Assistant

## 2015-06-24 VITALS — BP 102/84 | HR 73 | Temp 98.0°F | Resp 16 | Ht 66.75 in | Wt 166.5 lb

## 2015-06-24 DIAGNOSIS — B9689 Other specified bacterial agents as the cause of diseases classified elsewhere: Secondary | ICD-10-CM

## 2015-06-24 DIAGNOSIS — J329 Chronic sinusitis, unspecified: Secondary | ICD-10-CM | POA: Diagnosis not present

## 2015-06-24 DIAGNOSIS — A499 Bacterial infection, unspecified: Secondary | ICD-10-CM

## 2015-06-24 MED ORDER — AZELASTINE HCL 0.1 % NA SOLN
2.0000 | Freq: Two times a day (BID) | NASAL | Status: DC
Start: 2015-06-24 — End: 2016-10-03

## 2015-06-24 MED ORDER — DOXYCYCLINE HYCLATE 100 MG PO CAPS: 100.0000 mg | ORAL_CAPSULE | Freq: Two times a day (BID) | ORAL | Status: DC

## 2015-06-24 MED ORDER — BENZONATATE 100 MG PO CAPS: 100.0000 mg | ORAL_CAPSULE | Freq: Three times a day (TID) | ORAL | Status: DC | PRN

## 2015-06-24 MED FILL — DOXYCYCLINE HYC 100 MG CAP: 100 | 10 days supply | Qty: 20 | Fill #0

## 2015-06-24 MED FILL — AZELASTINE 0.1% (137 MCG) S: 0.1 | 30 days supply | Qty: 30 | Fill #0

## 2015-06-24 MED FILL — BENZONATATE 100 MG CAPSULE: 100 | 10 days supply | Qty: 30 | Fill #0

## 2015-06-24 NOTE — Progress Notes (Signed)
Pre visit review using our clinic review tool, if applicable. No additional management support is needed unless otherwise documented below in the visit note/SLS  

## 2015-06-24 NOTE — Patient Instructions (Signed)
Please take antibiotic as directed.  Increase fluid intake.  Use Saline nasal spray.  Take a daily multivitamin. Continue allergy medications as directed, starting the Astelin.  Place a humidifier in the bedroom.  Please call or return clinic if symptoms are not improving.  Sinusitis Sinusitis is redness, soreness, and swelling (inflammation) of the paranasal sinuses. Paranasal sinuses are air pockets within the bones of your face (beneath the eyes, the middle of the forehead, or above the eyes). In healthy paranasal sinuses, mucus is able to drain out, and air is able to circulate through them by way of your nose. However, when your paranasal sinuses are inflamed, mucus and air can become trapped. This can allow bacteria and other germs to grow and cause infection. Sinusitis can develop quickly and last only a short time (acute) or continue over a long period (chronic). Sinusitis that lasts for more than 12 weeks is considered chronic.  CAUSES  Causes of sinusitis include:  Allergies.  Structural abnormalities, such as displacement of the cartilage that separates your nostrils (deviated septum), which can decrease the air flow through your nose and sinuses and affect sinus drainage.  Functional abnormalities, such as when the small hairs (cilia) that line your sinuses and help remove mucus do not work properly or are not present. SYMPTOMS  Symptoms of acute and chronic sinusitis are the same. The primary symptoms are pain and pressure around the affected sinuses. Other symptoms include:  Upper toothache.  Earache.  Headache.  Bad breath.  Decreased sense of smell and taste.  A cough, which worsens when you are lying flat.  Fatigue.  Fever.  Thick drainage from your nose, which often is green and may contain pus (purulent).  Swelling and warmth over the affected sinuses. DIAGNOSIS  Your caregiver will perform a physical exam. During the exam, your caregiver may:  Look in your  nose for signs of abnormal growths in your nostrils (nasal polyps).  Tap over the affected sinus to check for signs of infection.  View the inside of your sinuses (endoscopy) with a special imaging device with a light attached (endoscope), which is inserted into your sinuses. If your caregiver suspects that you have chronic sinusitis, one or more of the following tests may be recommended:  Allergy tests.  Nasal culture A sample of mucus is taken from your nose and sent to a lab and screened for bacteria.  Nasal cytology A sample of mucus is taken from your nose and examined by your caregiver to determine if your sinusitis is related to an allergy. TREATMENT  Most cases of acute sinusitis are related to a viral infection and will resolve on their own within 10 days. Sometimes medicines are prescribed to help relieve symptoms (pain medicine, decongestants, nasal steroid sprays, or saline sprays).  However, for sinusitis related to a bacterial infection, your caregiver will prescribe antibiotic medicines. These are medicines that will help kill the bacteria causing the infection.  Rarely, sinusitis is caused by a fungal infection. In theses cases, your caregiver will prescribe antifungal medicine. For some cases of chronic sinusitis, surgery is needed. Generally, these are cases in which sinusitis recurs more than 3 times per year, despite other treatments. HOME CARE INSTRUCTIONS   Drink plenty of water. Water helps thin the mucus so your sinuses can drain more easily.  Use a humidifier.  Inhale steam 3 to 4 times a day (for example, sit in the bathroom with the shower running).  Apply a warm, moist washcloth to  your face 3 to 4 times a day, or as directed by your caregiver.  Use saline nasal sprays to help moisten and clean your sinuses.  Take over-the-counter or prescription medicines for pain, discomfort, or fever only as directed by your caregiver. SEEK IMMEDIATE MEDICAL CARE  IF:  You have increasing pain or severe headaches.  You have nausea, vomiting, or drowsiness.  You have swelling around your face.  You have vision problems.  You have a stiff neck.  You have difficulty breathing. MAKE SURE YOU:   Understand these instructions.  Will watch your condition.  Will get help right away if you are not doing well or get worse. Document Released: 01/08/2005 Document Revised: 04/02/2011 Document Reviewed: 01/23/2011 [facility] Patient Information 2014 [place], [place].

## 2015-06-24 NOTE — Progress Notes (Signed)
Patient presents to clinic today c/o several weeks of sinus pressure, sinus pain , fatigue, now with chest congestion and productive cough over the past few days. Denies fever, chills. Has been painting the garage over the past few days.  Past Medical History  Diagnosis Date  . High cholesterol   . Environmental allergies   . History of chicken pox   . Hearing loss     Hearing Aids, bilateral    Current Outpatient Prescriptions on File Prior to Visit  Medication Sig Dispense Refill  . alendronate (FOSAMAX) 70 MG tablet Take 1 tablet (70 mg total) by mouth once a week. Take with a full glass of water on an empty stomach. 4 tablet 2  . Ascorbic Acid (VITAMIN C) 1000 MG tablet Take 1,000 mg by mouth daily.     Marland Kitchen atorvastatin (LIPITOR) 40 MG tablet Take 40 mg by mouth daily.    . calcium-vitamin D (CALCIUM 500+D HIGH POTENCY) 500-400 MG-UNIT tablet Take 2 tablets by mouth daily.     . fluticasone (FLONASE) 50 MCG/ACT nasal spray Place 2 sprays into both nostrils daily.    . meloxicam (MOBIC) 15 MG tablet Take 1 tablet (15 mg total) by mouth daily. (Patient taking differently: Take 15 mg by mouth daily as needed. ) 30 tablet 0  . Multiple Vitamin (MULTI-VITAMINS) TABS Take 1 tablet by mouth daily.    . Omega-3 Fatty Acids (FISH OIL) 1000 MG CAPS Take 1 capsule by mouth daily.    . tizanidine (ZANAFLEX) 2 MG capsule Take 1 capsule (2 mg total) by mouth 3 (three) times daily as needed for muscle spasms. 30 capsule 0   No current facility-administered medications on file prior to visit.    Allergies  Allergen Reactions  . Penicillins Anaphylaxis and Rash  . Darvon [Propoxyphene] Swelling  . Vicodin [Hydrocodone-Acetaminophen] Other (See Comments)    Unknown reaction  . Methocarbamol Rash    Family History  Problem Relation Age of Onset  . COPD Mother   . Heart disease Mother   . Hypertension Father   . Crohn's disease Sister   . Hypertension Brother   . Heart disease Brother     . Kidney disease Son   . Tuberculosis Maternal Grandmother   . Leukemia Maternal Grandfather     Social History   Social History  . Marital Status: Married    Spouse Name: N/A  . Number of Children: N/A  . Years of Education: N/A   Social History Main Topics  . Smoking status: Never Smoker   . Smokeless tobacco: Never Used  . Alcohol Use: No  . Drug Use: No  . Sexual Activity: Not Asked   Other Topics Concern  . None   Social History Narrative    Review of Systems - See HPI.  All other ROS are negative.  BP 102/84 mmHg  Pulse 73  Temp(Src) 98 F (36.7 C) (Oral)  Resp 16  Ht 5' 6.75" (1.695 m)  Wt 166 lb 8 oz (75.524 kg)  BMI 26.29 kg/m2  SpO2 99%  Physical Exam  Recent Results (from the past 2160 hour(s))  Basic metabolic panel     Status: Abnormal   Collection Time: 04/06/15  9:08 AM  Result Value Ref Range   Sodium 141 135 - 145 mEq/L   Potassium 3.8 3.5 - 5.1 mEq/L   Chloride 105 96 - 112 mEq/L   CO2 30 19 - 32 mEq/L   Glucose, Bld 103 (H) 70 -  99 mg/dL   BUN 16 6 - 23 mg/dL   Creatinine, Ser 0.76 0.40 - 1.20 mg/dL   Calcium 9.7 8.4 - 10.5 mg/dL   GFR 81.00 >60.00 mL/min  Hepatic function panel     Status: Abnormal   Collection Time: 04/06/15  9:08 AM  Result Value Ref Range   Total Bilirubin 2.2 (H) 0.2 - 1.2 mg/dL   Bilirubin, Direct 0.4 (H) 0.0 - 0.3 mg/dL   Alkaline Phosphatase 83 39 - 117 U/L   AST 27 0 - 37 U/L   ALT 34 0 - 35 U/L   Total Protein 7.7 6.0 - 8.3 g/dL   Albumin 4.3 3.5 - 5.2 g/dL  Lipid panel     Status: Abnormal   Collection Time: 04/06/15  9:08 AM  Result Value Ref Range   Cholesterol 148 0 - 200 mg/dL    Comment: ATP III Classification       Desirable:  < 200 mg/dL               Borderline High:  200 - 239 mg/dL          High:  > = 240 mg/dL   Triglycerides 189.0 (H) 0.0 - 149.0 mg/dL    Comment: Normal:  <150 mg/dLBorderline High:  150 - 199 mg/dL   HDL 50.50 >39.00 mg/dL   VLDL 37.8 0.0 - 40.0 mg/dL   LDL  Cholesterol 60 0 - 99 mg/dL   Total CHOL/HDL Ratio 3     Comment:                Men          Women1/2 Average Risk     3.4          3.3Average Risk          5.0          4.42X Average Risk          9.6          7.13X Average Risk          15.0          11.0                       NonHDL 97.66     Comment: NOTE:  Non-HDL goal should be 30 mg/dL higher than patient's LDL goal (i.e. LDL goal of < 70 mg/dL, would have non-HDL goal of < 100 mg/dL)    Assessment/Plan: 1. Bacterial sinusitis  Rx Doxycycline.  Increase fluids.  Rest.  Saline nasal spray.  Probiotic.  Mucinex as directed.  Humidifier in bedroom. Continue allergy medications. Will start Astelin as well.  Call or return to clinic if symptoms are not improving.    Leeanne Rio, PA-C

## 2015-06-27 MED FILL — ALENDRONATE NA 70 MG TAB: 70 | 28 days supply | Qty: 4 | Fill #2

## 2015-07-08 ENCOUNTER — Encounter: Payer: Self-pay | Admitting: Physician Assistant

## 2015-07-13 ENCOUNTER — Telehealth: Payer: Self-pay | Admitting: Physician Assistant

## 2015-07-13 DIAGNOSIS — D2260 Melanocytic nevi of unspecified upper limb, including shoulder: Secondary | ICD-10-CM

## 2015-07-13 DIAGNOSIS — D2360 Other benign neoplasm of skin of unspecified upper limb, including shoulder: Secondary | ICD-10-CM

## 2015-07-13 NOTE — Telephone Encounter (Signed)
Can be reached: [phone]   Reason for call: Pt has mole on upper arm that is dark and rough feeling. She is asking if she can be referred to dermatology or if she needs appt here. Please advise.

## 2015-07-13 NOTE — Telephone Encounter (Signed)
Patient informed, understood & agreed; referral placed/SLS 06/21

## 2015-07-13 NOTE — Telephone Encounter (Signed)
Would recommend referral to [facility] -- Dr. Melina Copa if possible. If patient agrees, ok to place referral.

## 2015-07-28 DIAGNOSIS — D485 Neoplasm of uncertain behavior of skin: Secondary | ICD-10-CM | POA: Diagnosis not present

## 2015-07-28 DIAGNOSIS — L820 Inflamed seborrheic keratosis: Secondary | ICD-10-CM | POA: Diagnosis not present

## 2015-08-03 ENCOUNTER — Ambulatory Visit (INDEPENDENT_AMBULATORY_CARE_PROVIDER_SITE_OTHER): Payer: PPO | Admitting: Physician Assistant

## 2015-08-03 VITALS — BP 126/84 | HR 65 | Temp 97.8°F | Resp 16 | Ht 67.0 in | Wt 165.4 lb

## 2015-08-03 DIAGNOSIS — M8580 Other specified disorders of bone density and structure, unspecified site: Secondary | ICD-10-CM

## 2015-08-03 DIAGNOSIS — M6248 Contracture of muscle, other site: Secondary | ICD-10-CM | POA: Diagnosis not present

## 2015-08-03 DIAGNOSIS — M62838 Other muscle spasm: Secondary | ICD-10-CM

## 2015-08-03 MED ORDER — ALENDRONATE SODIUM 70 MG PO TABS: 70.0000 mg | ORAL_TABLET | ORAL | Status: DC

## 2015-08-03 MED ORDER — MELOXICAM 15 MG PO TABS: 15.0000 mg | ORAL_TABLET | Freq: Every day | ORAL | Status: DC

## 2015-08-03 MED ORDER — TIZANIDINE HCL 2 MG PO CAPS: 2.0000 mg | ORAL_CAPSULE | Freq: Three times a day (TID) | ORAL | Status: DC | PRN

## 2015-08-03 MED FILL — ALENDRONATE NA 70 MG TAB: 70 | 84 days supply | Qty: 12 | Fill #0

## 2015-08-03 MED FILL — MELOXICAM 15 MG TABLET: 15 | 30 days supply | Qty: 30 | Fill #0

## 2015-08-03 MED FILL — tiZANidine HCL 2 MG TABS: 2 | 10 days supply | Qty: 30 | Fill #0

## 2015-08-03 NOTE — Patient Instructions (Signed)
Please take the Mobic and Tizanidine as directed. Limit heavy lifting. Apply [facility] or Aspercreme to the area. Heating pad and massages will be beneficial.  Let me know if symptoms are not improving.

## 2015-08-03 NOTE — Progress Notes (Signed)
Patient presents to clinic today for follow up regarding new medication. Was started on Fosamax for osteopenia with concerning FRAX score. Is taking as directed without side effects. Patient is making sure to take her Calcium-Vitamin D supplement as instructed. Is doing some resistance training for strengthening.  Patient c/o 2 days of R neck and shoulder pain after lifting one of her grandchildren. Notes a knot in the muscles of her neck. Denies radiation of pain. Denies weakness, numbness or tingling of RUE. Denies symptoms of LUE. Has not taken anything for symptoms.  Past Medical History  Diagnosis Date  . High cholesterol   . Environmental allergies   . History of chicken pox   . Hearing loss     Hearing Aids, bilateral    Current Outpatient Prescriptions on File Prior to Visit  Medication Sig Dispense Refill  . alendronate (FOSAMAX) 70 MG tablet Take 1 tablet (70 mg total) by mouth once a week. Take with a full glass of water on an empty stomach. 4 tablet 2  . Ascorbic Acid (VITAMIN C) 1000 MG tablet Take 1,000 mg by mouth daily.     Marland Kitchen atorvastatin (LIPITOR) 40 MG tablet Take 40 mg by mouth daily.    Marland Kitchen azelastine (ASTELIN) 0.1 % nasal spray Place 2 sprays into both nostrils 2 (two) times daily. Use in each nostril as directed 30 mL 12  . calcium-vitamin D (CALCIUM 500+D HIGH POTENCY) 500-400 MG-UNIT tablet Take 2 tablets by mouth daily.     . cetirizine (ZYRTEC) 10 MG tablet Take 10 mg by mouth daily.    . fluticasone (FLONASE) 50 MCG/ACT nasal spray Place 2 sprays into both nostrils daily.    . Multiple Vitamin (MULTI-VITAMINS) TABS Take 1 tablet by mouth daily.    . Omega-3 Fatty Acids (FISH OIL) 1000 MG CAPS Take 1 capsule by mouth daily.    . tizanidine (ZANAFLEX) 2 MG capsule Take 1 capsule (2 mg total) by mouth 3 (three) times daily as needed for muscle spasms. 30 capsule 0  . benzonatate (TESSALON) 100 MG capsule Take 1 capsule (100 mg total) by mouth 3 (three) times daily  as needed. (Patient not taking: Reported on 08/03/2015) 30 capsule 0  . meloxicam (MOBIC) 15 MG tablet Take 1 tablet (15 mg total) by mouth daily. (Patient not taking: Reported on 08/03/2015) 30 tablet 0   No current facility-administered medications on file prior to visit.    Allergies  Allergen Reactions  . Penicillins Anaphylaxis and Rash  . Darvon [Propoxyphene] Swelling  . Vicodin [Hydrocodone-Acetaminophen] Other (See Comments)    Unknown reaction  . Methocarbamol Rash    Family History  Problem Relation Age of Onset  . COPD Mother   . Heart disease Mother   . Hypertension Father   . Crohn's disease Sister   . Hypertension Brother   . Heart disease Brother   . Kidney disease Son   . Tuberculosis Maternal Grandmother   . Leukemia Maternal Grandfather     Social History   Social History  . Marital Status: Married    Spouse Name: N/A  . Number of Children: N/A  . Years of Education: N/A   Social History Main Topics  . Smoking status: Never Smoker   . Smokeless tobacco: Never Used  . Alcohol Use: No  . Drug Use: No  . Sexual Activity: Not on file   Other Topics Concern  . Not on file   Social History Narrative    Review of  Systems - See HPI.  All other ROS are negative.  BP 126/84 mmHg  Pulse 65  Temp(Src) 97.8 F (36.6 C) (Oral)  Resp 16  Ht 5\' 7"  (1.702 m)  Wt 165 lb 6 oz (75.014 kg)  BMI 25.90 kg/m2  SpO2 99%  Physical Exam  Constitutional: She is oriented to person, place, and time and well-developed, well-nourished, and in no distress.  HENT:  Head: Normocephalic and atraumatic.  Cardiovascular: Normal rate, regular rhythm, normal heart sounds and intact distal pulses.   Pulmonary/Chest: Effort normal and breath sounds normal. No respiratory distress. She has no wheezes. She has no rales. She exhibits no tenderness.  Musculoskeletal:       Cervical back: She exhibits tenderness and spasm. She exhibits normal range of motion and no bony  tenderness.       Back:  Neurological: She is alert and oriented to person, place, and time.  Skin: Skin is warm and dry. No rash noted.  Psychiatric: Affect normal.  Vitals reviewed.  Assessment/Plan: 1. Osteopenia Tolerating Fosamax and supplementation well. Will continue. Repeat DEXA 2 years.  2. Muscle spasms of neck Rx Zanaflex. Rx mobic to use as directed over the next few days. No heavy lifting or overexertion. Icy Hot and heating pad recommended. FU if not resolving.   Leeanne Rio, PA-C

## 2015-08-05 MED FILL — AZELASTINE 0.1% (137 MCG) S: 0.1 | 30 days supply | Qty: 30 | Fill #1

## 2015-08-31 ENCOUNTER — Ambulatory Visit (INDEPENDENT_AMBULATORY_CARE_PROVIDER_SITE_OTHER): Payer: PPO | Admitting: Physician Assistant

## 2015-08-31 ENCOUNTER — Ambulatory Visit (HOSPITAL_BASED_OUTPATIENT_CLINIC_OR_DEPARTMENT_OTHER)
Admission: RE | Admit: 2015-08-31 | Discharge: 2015-08-31 | Disposition: A | Discharge status: Home / Self Care | Payer: PPO | Source: Ambulatory Visit | Attending: Physician Assistant | Admitting: Physician Assistant

## 2015-08-31 ENCOUNTER — Encounter: Payer: Self-pay | Admitting: Physician Assistant

## 2015-08-31 VITALS — BP 124/86 | HR 69 | Temp 97.9°F | Resp 16 | Ht 67.0 in | Wt 167.0 lb

## 2015-08-31 DIAGNOSIS — J9801 Acute bronchospasm: Secondary | ICD-10-CM | POA: Diagnosis not present

## 2015-08-31 DIAGNOSIS — J00 Acute nasopharyngitis [common cold]: Secondary | ICD-10-CM

## 2015-08-31 DIAGNOSIS — B9689 Other specified bacterial agents as the cause of diseases classified elsewhere: Secondary | ICD-10-CM

## 2015-08-31 DIAGNOSIS — R05 Cough: Secondary | ICD-10-CM | POA: Diagnosis not present

## 2015-08-31 DIAGNOSIS — J208 Acute bronchitis due to other specified organisms: Secondary | ICD-10-CM

## 2015-08-31 MED ORDER — ALBUTEROL SULFATE HFA 108 (90 BASE) MCG/ACT IN AERS
2.0000 | INHALATION_SPRAY | Freq: Four times a day (QID) | RESPIRATORY_TRACT | 0 refills | Status: DC | PRN
Start: 2015-08-31 — End: 2016-12-11

## 2015-08-31 MED ORDER — BENZONATATE 100 MG PO CAPS: 100.0000 mg | ORAL_CAPSULE | Freq: Three times a day (TID) | ORAL | 0 refills | Status: DC | PRN

## 2015-08-31 MED ORDER — LEVOCETIRIZINE DIHYDROCHLORIDE 5 MG PO TABS: 5.0000 mg | ORAL_TABLET | Freq: Every evening | ORAL | 5 refills | Status: DC

## 2015-08-31 MED ORDER — LEVOFLOXACIN 500 MG PO TABS: 500.0000 mg | ORAL_TABLET | Freq: Every day | ORAL | 0 refills | Status: DC

## 2015-08-31 MED FILL — LEVOCETIRIZINE 5 MG TABLET: 5 | 30 days supply | Qty: 30 | Fill #0

## 2015-08-31 MED FILL — VENTOLIN HFA 90 MCG INHALER: 108 (90 BAS | 25 days supply | Qty: 18 | Fill #0

## 2015-08-31 MED FILL — BENZONATATE 100 MG CAPSULE: 100 | 5 days supply | Qty: 30 | Fill #0

## 2015-08-31 MED FILL — levoFLOXacin 500 MG TABS: 500 | 7 days supply | Qty: 7 | Fill #0

## 2015-08-31 NOTE — Patient Instructions (Signed)
Take antibiotic (Levaquin) as directed.  Increase fluids.  Get plenty of rest. Use Mucinex for congestion. Tessalon as directed for cough. Use the inhaler as directed for chest tightness or coughing spells. Do not use more than directed. Take a daily probiotic (I recommend Align or Culturelle, but even Activia Yogurt may be beneficial).  A humidifier placed in the bedroom may offer some relief for a dry, scratchy throat of nasal irritation.  Read information below on acute bronchitis. Please call or return to clinic if symptoms are not improving.  Acute Bronchitis Bronchitis is when the airways that extend from the windpipe into the lungs get red, puffy, and painful (inflamed). Bronchitis often causes thick spit (mucus) to develop. This leads to a cough. A cough is the most common symptom of bronchitis. In acute bronchitis, the condition usually begins suddenly and goes away over time (usually in 2 weeks). Smoking, allergies, and asthma can make bronchitis worse. Repeated episodes of bronchitis may cause more lung problems.  HOME CARE  Rest.  Drink enough fluids to keep your pee (urine) clear or pale yellow (unless you need to limit fluids as told by your doctor).  Only take over-the-counter or prescription medicines as told by your doctor.  Avoid smoking and secondhand smoke. These can make bronchitis worse. If you are a smoker, think about using nicotine gum or skin patches. Quitting smoking will help your lungs heal faster.  Reduce the chance of getting bronchitis again by:  Washing your hands often.  Avoiding people with cold symptoms.  Trying not to touch your hands to your mouth, nose, or eyes.  Follow up with your doctor as told.  GET HELP IF: Your symptoms do not improve after 1 week of treatment. Symptoms include:  Cough.  Fever.  Coughing up thick spit.  Body aches.  Chest congestion.  Chills.  Shortness of breath.  Sore throat.  GET HELP RIGHT AWAY IF:   You  have an increased fever.  You have chills.  You have severe shortness of breath.  You have bloody thick spit (sputum).  You throw up (vomit) often.  You lose too much body fluid (dehydration).  You have a severe headache.  You faint.  MAKE SURE YOU:   Understand these instructions.  Will watch your condition.  Will get help right away if you are not doing well or get worse. Document Released: 06/27/2007 Document Revised: 09/10/2012 Document Reviewed: 07/01/2012 [facility] Patient Information 2015 [place], [place]. This information is not intended to replace advice given to you by your health care provider. Make sure you discuss any questions you have with your health care provider.

## 2015-08-31 NOTE — Progress Notes (Signed)
Patient presents to clinic today c/o 2 months of chest congestion and productive cough. Denies fever, chills malaise or fatigue. Has been taking old Rx for Tessalon with some relief in cough. Denies SOB or wheezing. Has history of allergies for which she takes Loratadine, Flonase and Astelin with some control of symptoms..   Past Medical History:  Diagnosis Date  . Environmental allergies   . Hearing loss    Hearing Aids, bilateral  . High cholesterol   . History of chicken pox     Current Outpatient Prescriptions on File Prior to Visit  Medication Sig Dispense Refill  . alendronate (FOSAMAX) 70 MG tablet Take 1 tablet (70 mg total) by mouth once a week. Take with a full glass of water on an empty stomach. 12 tablet 3  . Ascorbic Acid (VITAMIN C) 1000 MG tablet Take 1,000 mg by mouth daily.     Marland Kitchen atorvastatin (LIPITOR) 40 MG tablet Take 40 mg by mouth daily.    Marland Kitchen azelastine (ASTELIN) 0.1 % nasal spray Place 2 sprays into both nostrils 2 (two) times daily. Use in each nostril as directed 30 mL 12  . benzonatate (TESSALON) 100 MG capsule Take 1 capsule (100 mg total) by mouth 3 (three) times daily as needed. (Patient not taking: Reported on 08/03/2015) 30 capsule 0  . calcium-vitamin D (CALCIUM 500+D HIGH POTENCY) 500-400 MG-UNIT tablet Take 2 tablets by mouth daily.     . cetirizine (ZYRTEC) 10 MG tablet Take 10 mg by mouth daily.    . fluticasone (FLONASE) 50 MCG/ACT nasal spray Place 2 sprays into both nostrils daily.    . meloxicam (MOBIC) 15 MG tablet Take 1 tablet (15 mg total) by mouth daily. 30 tablet 0  . Multiple Vitamin (MULTI-VITAMINS) TABS Take 1 tablet by mouth daily.    . Omega-3 Fatty Acids (FISH OIL) 1000 MG CAPS Take 1 capsule by mouth daily.    . tizanidine (ZANAFLEX) 2 MG capsule Take 1 capsule (2 mg total) by mouth 3 (three) times daily as needed for muscle spasms. 30 capsule 0   No current facility-administered medications on file prior to visit.     Allergies    Allergen Reactions  . Penicillins Anaphylaxis and Rash  . Darvon [Propoxyphene] Swelling  . Vicodin [Hydrocodone-Acetaminophen] Other (See Comments)    Unknown reaction  . Methocarbamol Rash    Family History  Problem Relation Age of Onset  . COPD Mother   . Heart disease Mother   . Hypertension Father   . Crohn's disease Sister   . Hypertension Brother   . Heart disease Brother   . Kidney disease Son   . Tuberculosis Maternal Grandmother   . Leukemia Maternal Grandfather     Social History   Social History  . Marital status: Married    Spouse name: N/A  . Number of children: N/A  . Years of education: N/A   Social History Main Topics  . Smoking status: Never Smoker  . Smokeless tobacco: Never Used  . Alcohol use No  . Drug use: No  . Sexual activity: Not on file   Other Topics Concern  . Not on file   Social History Narrative  . No narrative on file   Review of Systems - See HPI.  All other ROS are negative.  There were no vitals taken for this visit.  Physical Exam  Constitutional: She is well-developed, well-nourished, and in no distress.  HENT:  Head: Normocephalic and atraumatic.  Right  Ear: Tympanic membrane normal.  Left Ear: Tympanic membrane normal.  Nose: Mucosal edema and rhinorrhea present. Right sinus exhibits no maxillary sinus tenderness and no frontal sinus tenderness. Left sinus exhibits no maxillary sinus tenderness and no frontal sinus tenderness.  Mouth/Throat: Uvula is midline, oropharynx is clear and moist and mucous membranes are normal.  Eyes: Conjunctivae are normal.  Neck: Neck supple.  Cardiovascular: Normal rate, regular rhythm, normal heart sounds and intact distal pulses.   Pulmonary/Chest: Effort normal and breath sounds normal. No respiratory distress. She has no wheezes. She has no rales. She exhibits no tenderness.  Lymphadenopathy:    She has no cervical adenopathy.  Skin: Skin is warm and dry. No rash noted.   Psychiatric: Affect normal.   No results found for this or any previous visit (from the past 2160 hour(s)).  Assessment/Plan: 1. Acute bacterial bronchitis Despite recent ABX - Doxycycline. Afebrile. Will obtain CXR today. Rx Levaquin, tessalon. Supportive measures reviewed. Will alter regimen based on CXR results. - levofloxacin (LEVAQUIN) 500 MG tablet; Take 1 tablet (500 mg total) by mouth daily.  Dispense: 7 tablet; Refill: 0 - albuterol (PROVENTIL HFA;VENTOLIN HFA) 108 (90 Base) MCG/ACT inhaler; Inhale 2 puffs into the lungs every 6 (six) hours as needed for wheezing or shortness of breath.  Dispense: 1 Inhaler; Refill: 0 - benzonatate (TESSALON) 100 MG capsule; Take 1-2 capsules (100-200 mg total) by mouth 3 (three) times daily as needed.  Dispense: 30 capsule; Refill: 0 - DG Chest 2 View; Future  2. Bronchospasm Contributing to cough along with bronchitis and allergies. Rx Xyzal. Will Rx albuterol inhaler to use as directed.  - albuterol (PROVENTIL HFA;VENTOLIN HFA) 108 (90 Base) MCG/ACT inhaler; Inhale 2 puffs into the lungs every 6 (six) hours as needed for wheezing or shortness of breath.  Dispense: 1 Inhaler; Refill: 0 - DG Chest 2 View; Future   Leeanne Rio, PA-C

## 2015-08-31 NOTE — Progress Notes (Signed)
Pre visit review using our clinic review tool, if applicable. No additional management support is needed unless otherwise documented below in the visit note/SLS  

## 2015-09-29 MED FILL — LEVOCETIRIZINE 5 MG TABLET: 5 | 30 days supply | Qty: 30 | Fill #1

## 2015-10-07 ENCOUNTER — Ambulatory Visit: Payer: PPO | Admitting: Physician Assistant

## 2015-10-12 ENCOUNTER — Encounter: Payer: Self-pay | Admitting: Physician Assistant

## 2015-10-12 ENCOUNTER — Ambulatory Visit (INDEPENDENT_AMBULATORY_CARE_PROVIDER_SITE_OTHER): Payer: PPO | Admitting: Physician Assistant

## 2015-10-12 VITALS — BP 120/86 | HR 64 | Temp 98.0°F | Resp 16 | Ht 67.0 in | Wt 167.5 lb

## 2015-10-12 DIAGNOSIS — Z23 Encounter for immunization: Secondary | ICD-10-CM

## 2015-10-12 DIAGNOSIS — E785 Hyperlipidemia, unspecified: Secondary | ICD-10-CM | POA: Diagnosis not present

## 2015-10-12 LAB — COMPREHENSIVE METABOLIC PANEL
ALBUMIN: 4 g/dL (ref 3.5–5.2)
ALK PHOS: 71 U/L (ref 39–117)
ALT: 26 U/L (ref 0–35)
AST: 23 U/L (ref 0–37)
BUN: 17 mg/dL (ref 6–23)
CALCIUM: 9 mg/dL (ref 8.4–10.5)
CHLORIDE: 106 meq/L (ref 96–112)
CO2: 31 mEq/L (ref 19–32)
CREATININE: 0.72 mg/dL (ref 0.40–1.20)
GFR: 86.08 mL/min (ref >60.00)
Glucose, Bld: 87 mg/dL (ref 70–99)
POTASSIUM: 3.8 meq/L (ref 3.5–5.1)
SODIUM: 141 meq/L (ref 135–145)
TOTAL PROTEIN: 7.2 g/dL (ref 6.0–8.3)
Total Bilirubin: 2.1 mg/dL — ABNORMAL HIGH (ref 0.2–1.2)

## 2015-10-12 LAB — LIPID PANEL
CHOLESTEROL: 124 mg/dL (ref 0–200)
HDL: 45.1 mg/dL (ref >39.00)
LDL CALC: 44 mg/dL (ref 0–99)
NonHDL: 79.34
TRIGLYCERIDES: 178 mg/dL — AB (ref 0.0–149.0)
Total CHOL/HDL Ratio: 3
VLDL: 35.6 mg/dL (ref 0.0–40.0)

## 2015-10-12 MED ORDER — FLUTICASONE PROPIONATE 50 MCG/ACT NA SUSP: 2.0000 | Freq: Every day | NASAL | 1 refills | Status: DC

## 2015-10-12 MED FILL — FLUTICASONE PROP 50 MCG SPR: 50 | 90 days supply | Qty: 48 | Fill #0

## 2015-10-12 NOTE — Progress Notes (Signed)
Patient presents to clinic today for 6 month follow-up of hyperlipidemia. Is taking atorvastatin 40 mg daily. Tolerates without side effect. Is trying to eat a well-balanced diet overall but does eat out at least 3 x week. Recently got back from vacation and endorses eating more on vacation. No regular exercise but has set a goal of walking 30 minutes per day to start with.  Body mass index is 26.23 kg/m.Marland Kitchen   Past Medical History:  Diagnosis Date  . Environmental allergies   . Guillain Barr syndrome ([place])   . Hearing loss    Hearing Aids, bilateral  . High cholesterol   . History of chicken pox     Current Outpatient Prescriptions on File Prior to Visit  Medication Sig Dispense Refill  . albuterol (PROVENTIL HFA;VENTOLIN HFA) 108 (90 Base) MCG/ACT inhaler Inhale 2 puffs into the lungs every 6 (six) hours as needed for wheezing or shortness of breath. 1 Inhaler 0  . alendronate (FOSAMAX) 70 MG tablet Take 1 tablet (70 mg total) by mouth once a week. Take with a full glass of water on an empty stomach. 12 tablet 3  . Ascorbic Acid (VITAMIN C) 1000 MG tablet Take 1,000 mg by mouth daily.     Marland Kitchen atorvastatin (LIPITOR) 40 MG tablet Take 40 mg by mouth daily.    Marland Kitchen azelastine (ASTELIN) 0.1 % nasal spray Place 2 sprays into both nostrils 2 (two) times daily. Use in each nostril as directed 30 mL 12  . calcium-vitamin D (CALCIUM 500+D HIGH POTENCY) 500-400 MG-UNIT tablet Take 2 tablets by mouth daily.     . fluticasone (FLONASE) 50 MCG/ACT nasal spray Place 2 sprays into both nostrils daily.    Marland Kitchen levocetirizine (XYZAL) 5 MG tablet Take 1 tablet (5 mg total) by mouth every evening. 30 tablet 5  . Multiple Vitamin (MULTI-VITAMINS) TABS Take 1 tablet by mouth daily.    . Omega-3 Fatty Acids (FISH OIL) 1000 MG CAPS Take 1 capsule by mouth daily.    . benzonatate (TESSALON) 100 MG capsule Take 1-2 capsules (100-200 mg total) by mouth 3 (three) times daily as needed. (Patient not taking: Reported on  10/12/2015) 30 capsule 0  . tizanidine (ZANAFLEX) 2 MG capsule Take 1 capsule (2 mg total) by mouth 3 (three) times daily as needed for muscle spasms. (Patient not taking: Reported on 10/12/2015) 30 capsule 0   No current facility-administered medications on file prior to visit.     Allergies  Allergen Reactions  . Penicillins Anaphylaxis and Rash  . Darvon [Propoxyphene] Swelling  . Vicodin [Hydrocodone-Acetaminophen] Other (See Comments)    Unknown reaction  . Methocarbamol Rash    Family History  Problem Relation Age of Onset  . COPD Mother   . Heart disease Mother   . Hypertension Father   . Crohn's disease Sister   . Hypertension Brother   . Heart disease Brother   . Kidney disease Son   . Tuberculosis Maternal Grandmother   . Leukemia Maternal Grandfather     Social History   Social History  . Marital status: Married    Spouse name: N/A  . Number of children: N/A  . Years of education: N/A   Social History Main Topics  . Smoking status: Never Smoker  . Smokeless tobacco: Never Used  . Alcohol use No  . Drug use: No  . Sexual activity: Not Asked   Other Topics Concern  . None   Social History Narrative  . None  Review of Systems - See HPI.  All other ROS are negative.  BP 120/86 (BP Location: Right Arm, Patient Position: Sitting, Cuff Size: Normal)   Pulse 64   Temp 98 F (36.7 C) (Oral)   Resp 16   Ht 5' 7"  (1.702 m)   Wt 167 lb 8 oz (76 kg)   SpO2 99%   BMI 26.23 kg/m   Physical Exam  Constitutional: She is oriented to person, place, and time and well-developed, well-nourished, and in no distress.  HENT:  Head: Normocephalic and atraumatic.  Cardiovascular: Normal rate, regular rhythm, normal heart sounds and intact distal pulses.   Pulmonary/Chest: Effort normal and breath sounds normal. No respiratory distress. She has no wheezes. She has no rales. She exhibits no tenderness.  Neurological: She is alert and oriented to person, place, and  time.  Vitals reviewed.  Assessment/Plan: 1. Hyperlipidemia Taking statin as directed. Discussed dietary and exercise regimen. Will check fasting labs today. Will alter regimen based on results. - Lipid panel - Comp Met (CMET)   Leeanne Rio, PA-C

## 2015-10-12 NOTE — Addendum Note (Signed)
Addended by: Rockwell Germany on: 10/12/2015 02:09 PM   Modules accepted: Orders

## 2015-10-12 NOTE — Progress Notes (Signed)
Pre visit review using our clinic review tool, if applicable. No additional management support is needed unless otherwise documented below in the visit note/SLS  

## 2015-10-12 NOTE — Patient Instructions (Signed)
Please go to the lab for blood work. I will call you with your results. Continue medications.  Continue your new goal of walking for 30 minutes per day. Continue a well-balanced diet.   Follow-up in 6 months for your physical.

## 2015-10-13 ENCOUNTER — Telehealth: Payer: Self-pay | Admitting: Physician Assistant

## 2015-10-13 ENCOUNTER — Other Ambulatory Visit: Payer: Self-pay | Admitting: Physician Assistant

## 2015-10-13 MED ORDER — ATORVASTATIN CALCIUM 40 MG PO TABS: 40.0000 mg | ORAL_TABLET | Freq: Every day | ORAL | 5 refills | Status: DC

## 2015-10-13 NOTE — Telephone Encounter (Signed)
Spoke to the patient and site is red, swollen and hot to touch. Informed Dr. Lorelei Pont of message since provider off for the day.  Dr. Iver Nestle informed the patient to be put on the nurse visit schedule for Friday 10/14/15 to let a RN look at her injection site.  Patient agreed and Martinique Johnson scheduled her at 2.45 and was told to come in in the morning at her convenience. Patient agreed to do.

## 2015-10-13 NOTE — Telephone Encounter (Signed)
Relation to [id number] Call back number:[phone]   Reason for call:  Patient received phenomena injection 10/12/15 and was advised if there was a reaction to call office. Patient is experiencing near injection site redness and irration. Please advise patient directly.

## 2015-10-13 NOTE — Telephone Encounter (Signed)
Thank you for addressing in my absence. We will address tomorrow at nurse visit.

## 2015-10-14 ENCOUNTER — Ambulatory Visit (INDEPENDENT_AMBULATORY_CARE_PROVIDER_SITE_OTHER): Payer: PPO | Admitting: Physician Assistant

## 2015-10-14 DIAGNOSIS — T50905A Adverse effect of unspecified drugs, medicaments and biological substances, initial encounter: Secondary | ICD-10-CM

## 2015-10-14 DIAGNOSIS — T887XXA Unspecified adverse effect of drug or medicament, initial encounter: Secondary | ICD-10-CM

## 2015-10-14 DIAGNOSIS — E785 Hyperlipidemia, unspecified: Secondary | ICD-10-CM | POA: Diagnosis not present

## 2015-10-14 DIAGNOSIS — Z23 Encounter for immunization: Secondary | ICD-10-CM

## 2015-10-14 MED ORDER — DOXYCYCLINE HYCLATE 100 MG PO TABS: 100.0000 mg | ORAL_TABLET | Freq: Two times a day (BID) | ORAL | 0 refills | Status: DC

## 2015-10-14 MED FILL — ATORVASTATIN 40 MG TABLET: 40 | 30 days supply | Qty: 30 | Fill #0

## 2015-10-14 NOTE — Progress Notes (Signed)
Reviewed RN note. Plan is as stated. She will call if symptoms are not continuing to resolve.  Leeanne Rio, PA-C

## 2015-10-14 NOTE — Addendum Note (Signed)
Addended by: Rockwell Germany on: 10/14/2015 10:31 AM   Modules accepted: Orders

## 2015-10-14 NOTE — Progress Notes (Signed)
Pre visit review using our clinic review tool, if applicable. No additional management support is needed unless otherwise documented below in the visit note.  Pt in clinic today after a localized allergic reaction to pneumonia vaccine in right deltoid.  Pt states yesterday upper arm was sore, hot to touch with increased redness and swelling.  Today swelling has gone down some, still warm to touch and red.    She has taken ibuprofen for pain, but no antihistamine--used ice instead.     Provider came into assess.  Suggested the following:    Please continue to ice arm and take Xyzal at night.  Add Zantac OTC and Hydrocortisone cream as needed.    Take antibiotic only if arm becomes increasingly tender, hot and/or hard to touch. --Take Doxycycline 100 mg tablet--1 tablet twice a day for seven days. Call with any questions or concerns.

## 2015-10-14 NOTE — Patient Instructions (Signed)
Please continue to ice arm and take Xyzal at night.  Add Zantac OTC and Hydrocortisone cream as needed.    Take antibiotic only if arm becomes increasingly tender, hot and/or hard to touch. --Take Doxycycline 100 mg tablet--1 tablet twice a day for seven days. Call with any questions or concerns.     Post-Injection Inflammatory Reaction An inflammatory reaction is possible any time a needle is used to give an injection. It is called a post-injection inflammatory reaction because it happens after the needle is put through the skin. A reaction may start minutes after the injection was given, or the reaction may appear several hours after the injection was given. A reaction can last for several hours to several days. CAUSES  An injection reaction can be caused by different things. Possible causes include:  A reaction to the medicine or vaccine that was given.  An infection that occurs if germs get inside the body at the injection site. SYMPTOMS   Some symptoms may be found only at the injection site (localized reaction). These symptoms may include:  Itching.  Redness.  Warmth.  Swelling.  Tenderness.  Pain.  Some symptoms may show up in other parts of the body (systemic reaction). These symptoms may include:  Fever or chills.  Muscle aches.  Nausea.  Headache.  Dizziness. DIAGNOSIS  To determine if there is a post-injection inflammatory reaction, your caregiver may:  Do a physical exam.  Draw a circle around any redness near the injection site. This will help to show whether the redness is spreading. TREATMENT  Treatment will depend on what caused the reaction. Treatment will also vary based on how severe your reaction is. Common treatment methods include:  Putting an ice pack over the injection site.  Taking anti-inflammatory medicine, to reduce swelling and itching.  Taking an antibiotic.  Taking pain medicine. HOME CARE INSTRUCTIONS   Follow all your  caregiver's instructions carefully.  Keep the injection site clean.  You may put ice on the injection site.  Put ice in a plastic bag.  Place a towel between your skin and the bag.  Leave the ice on for 15-20 minutes, 03-04 times a day.  If the reaction is in a joint, you might need to rest the joint for a while. Ask your caregiver how active you can be.  Only take over-the-counter or prescription medicines for pain, fever, or discomfort as directed by your caregiver. Do not give aspirin to children. SEEK MEDICAL CARE IF:   You have any questions about your medicines.  Your pain, redness, warmth, swelling, or itching lasts for several hours.  You have a fever, chills, or muscle aches. SEEK IMMEDIATE MEDICAL CARE IF:  Your pain, swelling, itching, or redness gets worse.  You have trouble breathing.  Your child has a high-pitched cry or does not stop crying.   This information is not intended to replace advice given to you by your health care provider. Make sure you discuss any questions you have with your health care provider.   Document Released: 09/20/2010 Document Revised: 04/02/2011 Document Reviewed: 07/21/2014 Elsevier Interactive Patient Education [employer].

## 2015-11-01 MED FILL — LEVOCETIRIZINE 5 MG TABLET: 5 | 30 days supply | Qty: 30 | Fill #2

## 2015-11-01 MED FILL — ALENDRONATE NA 70 MG TAB: 70 | 84 days supply | Qty: 12 | Fill #1

## 2015-11-15 MED FILL — AZELASTINE 0.1% (137 MCG) S: 0.1 | 30 days supply | Qty: 30 | Fill #2

## 2015-11-16 MED FILL — ATORVASTATIN 40 MG TABLET: 40 | 30 days supply | Qty: 30 | Fill #1

## 2015-11-21 MED FILL — CLINDAMYCIN HCL 300 MG CAPS: 300 | 1 days supply | Qty: 3 | Fill #0

## 2015-12-12 MED FILL — LEVOCETIRIZINE 5 MG TABLET: 5 | 30 days supply | Qty: 30 | Fill #3

## 2015-12-20 MED FILL — ATORVASTATIN 40 MG TABLET: 40 | 30 days supply | Qty: 30 | Fill #2

## 2015-12-28 ENCOUNTER — Encounter: Payer: Self-pay | Admitting: Family Medicine

## 2015-12-28 ENCOUNTER — Ambulatory Visit (INDEPENDENT_AMBULATORY_CARE_PROVIDER_SITE_OTHER): Payer: PPO | Admitting: Family Medicine

## 2015-12-28 VITALS — BP 122/66 | HR 70 | Temp 97.7°F | Wt 165.2 lb

## 2015-12-28 DIAGNOSIS — J209 Acute bronchitis, unspecified: Secondary | ICD-10-CM | POA: Diagnosis not present

## 2015-12-28 DIAGNOSIS — R590 Localized enlarged lymph nodes: Secondary | ICD-10-CM

## 2015-12-28 MED ORDER — AZITHROMYCIN 250 MG PO TABS: ORAL_TABLET | ORAL | 0 refills | Status: DC

## 2015-12-28 MED FILL — AZITHROMYCIN 250 MG TABLET: 250 | 5 days supply | Qty: 6 | Fill #0

## 2015-12-28 NOTE — Progress Notes (Signed)
[place] at [facility] [address], [place], [place] [address] [phone]  Date:  12/28/2015   Name:  [name]   DOB:  Apr 17, 1950   MRN:  [id number]  PCP:  Leeanne Rio, PA-C    Chief Complaint: No chief complaint on file.   History of Present Illness:  [name] is a 65 y.o. very pleasant female patient who presents with the following:  Yesterday she noted a painful gland at the right corner of her jaw.  She noted it yesterday but was not sure if this was significant She has noted a cough for about 6 weeks - it was there, then went away and came back about 2 weeks ago She does cough up come phlegm on occasion No fever, no ST, no earache  She has not noted any tooth pain  She did see her DDS recently for a chipped tooth but this was on the left side.   No GI symptoms In general she is feeling well- however her cough is getting worse   Patient Active Problem List   Diagnosis Date Noted  . Routine history and physical examination of adult 04/06/2015  . Need for shingles vaccine 04/06/2015  . Environmental allergies 03/23/2015  . Hyperlipidemia 03/23/2015  . Gastro-esophageal reflux disease without esophagitis 02/04/2014  . 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 Osteopenia 07/20/2013    Past Medical History:  Diagnosis Date  . Environmental allergies   . Guillain Barr syndrome ([place])   . Hearing loss    Hearing Aids, bilateral  . High cholesterol   . History of chicken pox     Past Surgical History:  Procedure Laterality Date  . ABDOMINAL HYSTERECTOMY    . EYE SURGERY    . FOOT SURGERY    . KNEE ARTHROSCOPY    . MEDIAL PARTIAL KNEE REPLACEMENT  07-1998,08-1998    Social History  Substance Use Topics  . Smoking status: Never Smoker  . Smokeless tobacco: Never Used  . Alcohol use No    Family History  Problem Relation Age of Onset  . COPD Mother   . Heart disease  Mother   . Hypertension Father   . Crohn's disease Sister   . Hypertension Brother   . Heart disease Brother   . Kidney disease Son   . Tuberculosis Maternal Grandmother   . Leukemia Maternal Grandfather     Allergies  Allergen Reactions  . Penicillins Anaphylaxis and Rash  . Darvon [Propoxyphene] Swelling  . Vicodin [Hydrocodone-Acetaminophen] Other (See Comments)    Unknown reaction  . Methocarbamol Rash    Medication list has been reviewed and updated.  Current Outpatient Prescriptions on File Prior to Visit  Medication Sig Dispense Refill  . albuterol (PROVENTIL HFA;VENTOLIN HFA) 108 (90 Base) MCG/ACT inhaler Inhale 2 puffs into the lungs every 6 (six) hours as needed for wheezing or shortness of breath. 1 Inhaler 0  . alendronate (FOSAMAX) 70 MG tablet Take 1 tablet (70 mg total) by mouth once a week. Take with a full glass of water on an empty stomach. 12 tablet 3  . Ascorbic Acid (VITAMIN C) 1000 MG tablet Take 1,000 mg by mouth daily.     Marland Kitchen atorvastatin (LIPITOR) 40 MG tablet Take 1 tablet (40 mg total) by mouth daily. 30 tablet 5  . azelastine (ASTELIN) 0.1 % nasal spray Place 2 sprays into both nostrils 2 (two) times daily. Use in each  nostril as directed 30 mL 12  . calcium-vitamin D (CALCIUM 500+D HIGH POTENCY) 500-400 MG-UNIT tablet Take 2 tablets by mouth daily.     Marland Kitchen doxycycline (VIBRA-TABS) 100 MG tablet Take 1 tablet (100 mg total) by mouth 2 (two) times daily. 14 tablet 0  . fluticasone (FLONASE) 50 MCG/ACT nasal spray Place 2 sprays into both nostrils daily. 48 g 1  . levocetirizine (XYZAL) 5 MG tablet Take 1 tablet (5 mg total) by mouth every evening. 30 tablet 5  . Multiple Vitamin (MULTI-VITAMINS) TABS Take 1 tablet by mouth daily.    . Omega-3 Fatty Acids (FISH OIL) 1000 MG CAPS Take 1 capsule by mouth daily.    . tizanidine (ZANAFLEX) 2 MG capsule Take 1 capsule (2 mg total) by mouth 3 (three) times daily as needed for muscle spasms. (Patient not taking:  Reported on 10/12/2015) 30 capsule 0   No current facility-administered medications on file prior to visit.     Review of Systems:  As per HPI- otherwise negative.   Physical Examination: Ideal Body Weight:    GEN: WDWN, NAD, Non-toxic, A & O x 3, looks well HEENT: Atraumatic, Normocephalic. Neck supple. No masses.  Bilateral TM wnl, oropharynx normal.  PEERL,EOMI.   No tooth pain to pressure She has a tiny tender node at the angle of the right jaw  No other abnormal cervical, clavicular or axillary nodes Ears and Nose: No external deformity. CV: RRR, No M/G/R. No JVD. No thrill. No extra heart sounds. PULM: CTA B, no wheezes, crackles, rhonchi. No retractions. No resp. distress. No accessory muscle use.Sharma Covert EXTR: No c/c/e NEURO Normal gait.  PSYCH: Normally interactive. Conversant. Not depressed or anxious appearing.  Calm demeanor.    Assessment and Plan: Acute bronchitis, unspecified organism - Plan: azithromycin (ZITHROMAX) 250 MG tablet  Anterior cervical lymphadenopathy  Here today with a worsening cough for 2 weeks- will treat with azithromycin zpack; she will let me know if not better soon Advised that tiny palpable node is likely not of concern, but she will let me know if this does not resolve  Signed Lamar Blinks, MD

## 2015-12-28 NOTE — Patient Instructions (Signed)
We are going to treat your cough with azithromycin (zpack antibiotic). If not helpful please let me know  You do have a small lymph node at the right corner of your jaw.  I do not think this is cause for alarm, but let me know if not resolved in 2-3 weeks

## 2016-01-10 MED FILL — AZELASTINE 0.1% (137 MCG) S: 0.1 | 30 days supply | Qty: 30 | Fill #3

## 2016-01-10 MED FILL — ALENDRONATE NA 70 MG TAB: 70 | 84 days supply | Qty: 12 | Fill #2

## 2016-01-12 ENCOUNTER — Encounter: Payer: Self-pay | Admitting: Family Medicine

## 2016-01-12 DIAGNOSIS — R05 Cough: Secondary | ICD-10-CM

## 2016-01-12 DIAGNOSIS — R053 Chronic cough: Secondary | ICD-10-CM

## 2016-01-13 ENCOUNTER — Encounter: Payer: Self-pay | Admitting: Family Medicine

## 2016-01-13 ENCOUNTER — Other Ambulatory Visit: Payer: Self-pay | Admitting: Family Medicine

## 2016-01-13 ENCOUNTER — Ambulatory Visit (HOSPITAL_BASED_OUTPATIENT_CLINIC_OR_DEPARTMENT_OTHER)
Admission: RE | Admit: 2016-01-13 | Discharge: 2016-01-13 | Disposition: A | Discharge status: Home / Self Care | Payer: PPO | Source: Ambulatory Visit | Attending: Family Medicine | Admitting: Family Medicine

## 2016-01-13 DIAGNOSIS — I700 Atherosclerosis of aorta: Secondary | ICD-10-CM | POA: Insufficient documentation

## 2016-01-13 DIAGNOSIS — R059 Cough, unspecified: Secondary | ICD-10-CM

## 2016-01-13 DIAGNOSIS — R05 Cough: Secondary | ICD-10-CM

## 2016-01-13 DIAGNOSIS — R053 Chronic cough: Secondary | ICD-10-CM

## 2016-01-13 MED ORDER — BENZONATATE 100 MG PO CAPS: 100.0000 mg | ORAL_CAPSULE | Freq: Three times a day (TID) | ORAL | 0 refills | Status: DC | PRN

## 2016-01-13 MED FILL — BENZONATATE 100 MG CAPSULE: 100 | 13 days supply | Qty: 40 | Fill #0

## 2016-01-17 ENCOUNTER — Encounter: Payer: Self-pay | Admitting: Internal Medicine

## 2016-01-17 ENCOUNTER — Ambulatory Visit (INDEPENDENT_AMBULATORY_CARE_PROVIDER_SITE_OTHER): Payer: PPO | Admitting: Internal Medicine

## 2016-01-17 VITALS — BP 126/74 | HR 65 | Temp 98.2°F | Resp 14 | Ht 67.0 in | Wt 165.4 lb

## 2016-01-17 DIAGNOSIS — R05 Cough: Secondary | ICD-10-CM | POA: Diagnosis not present

## 2016-01-17 DIAGNOSIS — R059 Cough, unspecified: Secondary | ICD-10-CM

## 2016-01-17 MED ORDER — BENZONATATE 100 MG PO CAPS: 100.0000 mg | ORAL_CAPSULE | Freq: Three times a day (TID) | ORAL | 0 refills | Status: DC | PRN

## 2016-01-17 MED ORDER — BUDESONIDE-FORMOTEROL FUMARATE 80-4.5 MCG/ACT IN AERO: 2.0000 | INHALATION_SPRAY | Freq: Two times a day (BID) | RESPIRATORY_TRACT | 1 refills | Status: DC

## 2016-01-17 MED FILL — SYMBICORT 80-4.5 MCG INH: 80-4.5 | 30 days supply | Qty: 10 | Fill #0

## 2016-01-17 NOTE — Progress Notes (Signed)
Pre visit review using our clinic review tool, if applicable. No additional management support is needed unless otherwise documented below in the visit note. 

## 2016-01-17 NOTE — Progress Notes (Signed)
Subjective:    Patient ID: [name], female    DOB: [birth year], 65 y.o.   MRN: [id number]  DOS:  01/17/2016 Type of visit - description : acute. Chief complaint is cough Interval history:  Was seen at this office earlier this month with several weeks history of cough, was prescribed a Z-Pak. She did not improve, had a chest x-ray 01/13/2016 which was essentially negative. Here  because she is not better, continue with frequent cough, sometimes is in spells and they lasts up to 15 minutes.  Review of Systems Denies fever chills or weight loss No itchy eyes or nose, occasionally sneezing. No sinus pain, + minimal congestion with no discharge No lower extremity edema Admits to some wheezing, sometimes at night. No GERD No hemoptysis  Past Medical History:  Diagnosis Date  . Environmental allergies   . Guillain Barr syndrome ([place])   . Hearing loss    Hearing Aids, bilateral  . High cholesterol   . History of chicken pox     Past Surgical History:  Procedure Laterality Date  . ABDOMINAL HYSTERECTOMY    . EYE SURGERY    . FOOT SURGERY    . KNEE ARTHROSCOPY    . MEDIAL PARTIAL KNEE REPLACEMENT  07-1998,08-1998    Social History   Social History  . Marital status: Married    Spouse name: N/A  . Number of children: N/A  . Years of education: N/A   Occupational History  . Not on file.   Social History Main Topics  . Smoking status: Former Research scientist (life sciences)  . Smokeless tobacco: Never Used  . Alcohol use No  . Drug use: No  . Sexual activity: Not on file   Other Topics Concern  . Not on file   Social History Narrative  . No narrative on file      Allergies as of 01/17/2016      Reactions   Penicillins Anaphylaxis, Rash   Darvon [propoxyphene] Swelling   Vicodin [hydrocodone-acetaminophen] Other (See Comments)   Unknown reaction   Methocarbamol Rash      Medication List       Accurate as of 01/17/16  6:14 PM. Always use your most recent med list.          albuterol 108 (90 Base) MCG/ACT inhaler Commonly known as:  PROVENTIL HFA;VENTOLIN HFA Inhale 2 puffs into the lungs every 6 (six) hours as needed for wheezing or shortness of breath.   alendronate 70 MG tablet Commonly known as:  FOSAMAX Take 1 tablet (70 mg total) by mouth once a week. Take with a full glass of water on an empty stomach.   atorvastatin 40 MG tablet Commonly known as:  LIPITOR Take 1 tablet (40 mg total) by mouth daily.   azelastine 0.1 % nasal spray Commonly known as:  ASTELIN Place 2 sprays into both nostrils 2 (two) times daily. Use in each nostril as directed   benzonatate 100 MG capsule Commonly known as:  TESSALON Take 1 capsule (100 mg total) by mouth 3 (three) times daily as needed for cough.   budesonide-formoterol 80-4.5 MCG/ACT inhaler Commonly known as:  SYMBICORT Inhale 2 puffs into the lungs 2 (two) times daily.   CALCIUM 500+D HIGH POTENCY 500-400 MG-UNIT tablet Generic drug:  calcium-vitamin D Take 2 tablets by mouth daily.   Fish Oil 1000 MG Caps Take 1 capsule by mouth daily.   fluticasone 50 MCG/ACT nasal spray Commonly known as:  FLONASE Place 2 sprays into both  nostrils daily.   levocetirizine 5 MG tablet Commonly known as:  XYZAL Take 1 tablet (5 mg total) by mouth every evening.   MULTI-VITAMINS Tabs Take 1 tablet by mouth daily.   tizanidine 2 MG capsule Commonly known as:  ZANAFLEX Take 1 capsule (2 mg total) by mouth 3 (three) times daily as needed for muscle spasms.   vitamin C 1000 MG tablet Take 1,000 mg by mouth daily.          Objective:   Physical Exam BP 126/74 (BP Location: Left Arm, Patient Position: Sitting, Cuff Size: Normal)   Pulse 65   Temp 98.2 F (36.8 C) (Oral)   Resp 14   Ht 5\' 7"  (1.702 m)   Wt 165 lb 6 oz (75 kg)   SpO2 96%   BMI 25.90 kg/m  General:   Well developed, well nourished . NAD.  HEENT:  Normocephalic . Face symmetric, atraumatic. TMs normal, nose not congested, throat  symmetric. Lungs:  You rhonchi with cough only. Normal respiratory effort, no intercostal retractions, no accessory muscle use. Heart: RRR,  no murmur.  No pretibial edema bilaterally  Skin: Not pale. Not jaundice Neurologic:  alert & oriented X3.  Speech normal, gait appropriate for age and unassisted Psych--  Cognition and judgment appear intact.  Cooperative with normal attention span and concentration.  Behavior appropriate. No anxious or depressed appearing.      Assessment & Plan:   65 year old lady with history of hyperlipidemia, Guillain-Barr, osteopenia, GERD, remote smoker presents with the following: Cough: Several week history of cough, initially diagnosed as bronchitis, status post a Z-Pak.  She probably has a post infectious cough, she probably has a component of reactive airway disease as she was prescribed albuterol before. Review of systems negative for GERD but she wheezes sometimes. Plan: Symbicort for 3 weeks, continue Mucinex, Tessalon Perles. If not better let me know, consider a second round of antibiotics with doxycycline.

## 2016-01-17 NOTE — Patient Instructions (Addendum)
Symbicort 2 puffs twice a day for 3 weeks  Okay to use albuterol ( the old inhaler) if wheezing  Mucinex DM twice a day for one week, then as needed  We are refilling the [employer], use them as needed.  Call if not gradually improving in the next 2 or 3 weeks

## 2016-01-18 ENCOUNTER — Ambulatory Visit: Payer: PPO | Admitting: Family Medicine

## 2016-01-19 MED FILL — LEVOCETIRIZINE 5 MG TABLET: 5 | 30 days supply | Qty: 30 | Fill #4

## 2016-01-24 MED FILL — ATORVASTATIN 40 MG TABLET: 40 | 30 days supply | Qty: 30 | Fill #3

## 2016-01-27 DIAGNOSIS — Z471 Aftercare following joint replacement surgery: Secondary | ICD-10-CM | POA: Diagnosis not present

## 2016-01-27 DIAGNOSIS — Z96651 Presence of right artificial knee joint: Secondary | ICD-10-CM | POA: Diagnosis not present

## 2016-01-27 DIAGNOSIS — M25561 Pain in right knee: Secondary | ICD-10-CM | POA: Diagnosis not present

## 2016-01-27 DIAGNOSIS — M1711 Unilateral primary osteoarthritis, right knee: Secondary | ICD-10-CM | POA: Diagnosis not present

## 2016-02-20 MED FILL — LEVOCETIRIZINE 5 MG TABLET: 5 | 30 days supply | Qty: 30 | Fill #5

## 2016-02-20 MED FILL — ATORVASTATIN 40 MG TABLET: 40 | 30 days supply | Qty: 30 | Fill #4

## 2016-03-05 DIAGNOSIS — Z96651 Presence of right artificial knee joint: Secondary | ICD-10-CM | POA: Diagnosis not present

## 2016-03-05 DIAGNOSIS — Z471 Aftercare following joint replacement surgery: Secondary | ICD-10-CM | POA: Diagnosis not present

## 2016-03-05 DIAGNOSIS — M25561 Pain in right knee: Secondary | ICD-10-CM | POA: Diagnosis not present

## 2016-03-05 DIAGNOSIS — M25562 Pain in left knee: Secondary | ICD-10-CM | POA: Diagnosis not present

## 2016-03-05 MED FILL — DICLOFENAC SODIUM 1% GEL: 1 | 67 days supply | Qty: 400 | Fill #0

## 2016-03-26 DIAGNOSIS — Z01419 Encounter for gynecological examination (general) (routine) without abnormal findings: Secondary | ICD-10-CM | POA: Diagnosis not present

## 2016-03-26 DIAGNOSIS — Z1231 Encounter for screening mammogram for malignant neoplasm of breast: Secondary | ICD-10-CM | POA: Diagnosis not present

## 2016-03-26 MED FILL — AZELASTINE 0.1% (137 MCG) S: 0.1 | 30 days supply | Qty: 30 | Fill #4

## 2016-03-26 MED FILL — ATORVASTATIN 40 MG TABLET: 40 | 30 days supply | Qty: 30 | Fill #5

## 2016-03-26 MED FILL — FLUTICASONE PROP 50 MCG SPR: 50 | 90 days supply | Qty: 48 | Fill #1

## 2016-04-09 ENCOUNTER — Ambulatory Visit (INDEPENDENT_AMBULATORY_CARE_PROVIDER_SITE_OTHER): Payer: PPO | Admitting: Family Medicine

## 2016-04-09 ENCOUNTER — Telehealth: Payer: Self-pay | Admitting: Family Medicine

## 2016-04-09 ENCOUNTER — Encounter: Payer: Self-pay | Admitting: Family Medicine

## 2016-04-09 ENCOUNTER — Telehealth: Payer: Self-pay | Admitting: Physician Assistant

## 2016-04-09 VITALS — HR 66 | Temp 97.8°F | Ht 67.0 in | Wt 168.8 lb

## 2016-04-09 DIAGNOSIS — Z9109 Other allergy status, other than to drugs and biological substances: Secondary | ICD-10-CM

## 2016-04-09 DIAGNOSIS — Z130 Encounter for screening for diseases of the blood and blood-forming organs and certain disorders involving the immune mechanism: Secondary | ICD-10-CM

## 2016-04-09 DIAGNOSIS — Z1159 Encounter for screening for other viral diseases: Secondary | ICD-10-CM | POA: Diagnosis not present

## 2016-04-09 DIAGNOSIS — M8580 Other specified disorders of bone density and structure, unspecified site: Secondary | ICD-10-CM | POA: Diagnosis not present

## 2016-04-09 DIAGNOSIS — Z0000 Encounter for general adult medical examination without abnormal findings: Secondary | ICD-10-CM

## 2016-04-09 DIAGNOSIS — E785 Hyperlipidemia, unspecified: Secondary | ICD-10-CM

## 2016-04-09 DIAGNOSIS — R17 Unspecified jaundice: Secondary | ICD-10-CM | POA: Diagnosis not present

## 2016-04-09 DIAGNOSIS — R7309 Other abnormal glucose: Secondary | ICD-10-CM

## 2016-04-09 LAB — CBC
HEMATOCRIT: 38.6 % (ref 36.0–46.0)
HEMOGLOBIN: 13 g/dL (ref 12.0–15.0)
MCHC: 33.7 g/dL (ref 30.0–36.0)
MCV: 86.7 fl (ref 78.0–100.0)
Platelets: 176 10*3/uL (ref 150.0–400.0)
RBC: 4.45 Mil/uL (ref 3.87–5.11)
RDW: 13.6 % (ref 11.5–15.5)
WBC: 4.2 10*3/uL (ref 4.0–10.5)

## 2016-04-09 LAB — HEPATITIS C ANTIBODY: HCV Ab: NEGATIVE

## 2016-04-09 LAB — HEPATIC FUNCTION PANEL
ALBUMIN: 4.3 g/dL (ref 3.5–5.2)
ALK PHOS: 61 U/L (ref 39–117)
ALT: 29 U/L (ref 0–35)
AST: 24 U/L (ref 0–37)
Bilirubin, Direct: 0.4 mg/dL — ABNORMAL HIGH (ref 0.0–0.3)
TOTAL PROTEIN: 7.1 g/dL (ref 6.0–8.3)
Total Bilirubin: 2.3 mg/dL — ABNORMAL HIGH (ref 0.2–1.2)

## 2016-04-09 LAB — HEMOGLOBIN A1C: HEMOGLOBIN A1C: 5.7 % (ref 4.6–6.5)

## 2016-04-09 LAB — VITAMIN D 25 HYDROXY (VIT D DEFICIENCY, FRACTURES): VITD: 28.96 ng/mL — ABNORMAL LOW (ref 30.00–100.00)

## 2016-04-09 MED ORDER — ATORVASTATIN CALCIUM 40 MG PO TABS: 40.0000 mg | ORAL_TABLET | Freq: Every day | ORAL | 3 refills | Status: DC

## 2016-04-09 MED ORDER — BUDESONIDE-FORMOTEROL FUMARATE 80-4.5 MCG/ACT IN AERO: 2.0000 | INHALATION_SPRAY | Freq: Two times a day (BID) | RESPIRATORY_TRACT | 3 refills | Status: DC

## 2016-04-09 MED ORDER — LEVOCETIRIZINE DIHYDROCHLORIDE 5 MG PO TABS: 5.0000 mg | ORAL_TABLET | Freq: Every evening | ORAL | 3 refills | Status: DC

## 2016-04-09 MED FILL — XYZAL ALLERGY 24HR 5 MG TAB: 5 | 35 days supply | Qty: 35 | Fill #0

## 2016-04-09 NOTE — Telephone Encounter (Signed)
error 

## 2016-04-09 NOTE — Progress Notes (Signed)
[place] at [facility] [address] [place], [place] [address] [phone]  Date:  04/09/2016   Name:  [name]   DOB:  [birth year]   MRN:  [id number]  PCP:  Leeanne Rio, PA-C    Chief Complaint: Annual Exam   History of Present Illness:  [name] is a 66 y.o. very pleasant female patient who presents with the following:  Here for CPE today.  UTD on the following vaccines: Flu, Pneumonia, Shingles, Tdap. Last Dexa scan on 05-04-2015: The BMD measured at AP Spine L1-L4 is 0.844 g/cm2 with a T-score of -2.8. This patient is considered osteoporotic according to [facility]) criteria.  Patient on Fosamax. Mammogram on 03-16-2015, normal per patient report.   Using Symbicort every other day.  Is requesting a refill on Symbicort which she uses for allergy associated wheezing.  Has not needed her Albuterol in 2018.   Having a little post-nasal drip from allergies.  Requesting Xyzal refill.  Requesting Lipitor refill.   Fasting today.  Denies SOB, CP, myalgias, acid reflux issues.  Does not eat fried foods, but does like sweets, eats lots of chicken.  Getting some physical activity, but plans to do more with her husband in order to lose some weight.   Bilateral hearing aids for last 1 year.  Started out with just left ear 10 years ago.  Has followed up with [facility].  Going back in September 2018. s/p hysterectomy  Patient Active Problem List   Diagnosis Date Noted  . Routine history and physical examination of adult 04/06/2015  . Need for shingles vaccine 04/06/2015  . Environmental allergies 03/23/2015  . Hyperlipidemia 03/23/2015  . Gastro-esophageal reflux disease without esophagitis 02/04/2014  . 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 Osteopenia 07/20/2013    Past Medical History:  Diagnosis Date  . Environmental allergies   . Guillain Barr syndrome  ([place])   . Hearing loss    Hearing Aids, bilateral  . High cholesterol   . History of chicken pox     Past Surgical History:  Procedure Laterality Date  . ABDOMINAL HYSTERECTOMY    . EYE SURGERY    . FOOT SURGERY    . KNEE ARTHROSCOPY    . MEDIAL PARTIAL KNEE REPLACEMENT  07-1998,08-1998    Social History  Substance Use Topics  . Smoking status: Former Research scientist (life sciences)  . Smokeless tobacco: Never Used  . Alcohol use No    Family History  Problem Relation Age of Onset  . COPD Mother   . Heart disease Mother   . Hypertension Father   . Crohn's disease Sister   . Hypertension Brother   . Heart disease Brother   . Kidney disease Son   . Tuberculosis Maternal Grandmother   . Leukemia Maternal Grandfather     Allergies  Allergen Reactions  . Penicillins Anaphylaxis and Rash  . Darvon [Propoxyphene] Swelling  . Vicodin [Hydrocodone-Acetaminophen] Other (See Comments)    Unknown reaction  . Methocarbamol Rash    Medication list has been reviewed and updated.  Current Outpatient Prescriptions on File Prior to Visit  Medication Sig Dispense Refill  . albuterol (PROVENTIL HFA;VENTOLIN HFA) 108 (90 Base) MCG/ACT inhaler Inhale 2 puffs into the lungs every 6 (six) hours as needed for wheezing or shortness of breath. 1 Inhaler 0  . alendronate (FOSAMAX) 70 MG tablet Take 1 tablet (70 mg total) by mouth once a  week. Take with a full glass of water on an empty stomach. 12 tablet 3  . Ascorbic Acid (VITAMIN C) 1000 MG tablet Take 1,000 mg by mouth daily.     Marland Kitchen azelastine (ASTELIN) 0.1 % nasal spray Place 2 sprays into both nostrils 2 (two) times daily. Use in each nostril as directed 30 mL 12  . calcium-vitamin D (CALCIUM 500+D HIGH POTENCY) 500-400 MG-UNIT tablet Take 2 tablets by mouth daily.     . fluticasone (FLONASE) 50 MCG/ACT nasal spray Place 2 sprays into both nostrils daily. 48 g 1  . Multiple Vitamin (MULTI-VITAMINS) TABS Take 1 tablet by mouth daily.    . Omega-3 Fatty Acids  (FISH OIL) 1000 MG CAPS Take 1 capsule by mouth daily.    . benzonatate (TESSALON) 100 MG capsule Take 1 capsule (100 mg total) by mouth 3 (three) times daily as needed for cough. (Patient not taking: Reported on 04/09/2016) 30 capsule 0  . tizanidine (ZANAFLEX) 2 MG capsule Take 1 capsule (2 mg total) by mouth 3 (three) times daily as needed for muscle spasms. (Patient not taking: Reported on 04/09/2016) 30 capsule 0   No current facility-administered medications on file prior to visit.     Review of Systems:  Review of Systems  Constitutional: Negative for chills, fever, malaise/fatigue and weight loss.  HENT: Positive for hearing loss. Negative for congestion, ear pain, sinus pain, sore throat and tinnitus.        Has bilateral hearing aids.  Eyes: Negative for blurred vision.  Respiratory: Negative for cough and wheezing.   Cardiovascular: Negative for chest pain, palpitations, claudication and leg swelling.  Gastrointestinal: Negative for abdominal pain, constipation, diarrhea, heartburn, nausea and vomiting.  Musculoskeletal: Negative for myalgias.  All other systems reviewed and are negative.    Physical Examination: Vitals:   04/09/16 0831  Pulse: 66  Temp: 97.8 F (36.6 C)   Vitals:   04/09/16 0831  Weight: 168 lb 12.8 oz (76.6 kg)  Height: 5\' 7"  (1.702 m)   Body mass index is 26.44 kg/m. Ideal Body Weight: Weight in (lb) to have BMI = 25: 159.3  Physical Examination: General appearance - alert, well appearing, and in no distress, oriented to person, place, and time and normal appearing weight Mental status - alert, oriented to person, place, and time, normal mood, behavior, speech, dress, motor activity, and thought processes Eyes - pupils equal and reactive, extraocular eye movements intact Ears - bilateral TM's and external ear canals normal Nose - normal and patent, no erythema, discharge or polyps Mouth - mucous membranes moist, pharynx normal without  lesions Neck - supple, no significant adenopathy Lymphatics - no palpable lymphadenopathy, no hepatosplenomegaly Chest - clear to auscultation, no wheezes, rales or rhonchi, symmetric air entry Heart - normal rate, regular rhythm, normal S1, S2, no murmurs, rubs, clicks or gallops Abdomen - soft, nontender, nondistended, no masses or organomegaly Neurological - alert, oriented, normal speech, no focal findings or movement disorder noted, hearing loss Right > Left Musculoskeletal - no joint tenderness, deformity or swelling Extremities - peripheral pulses normal, no pedal edema, no clubbing or cyanosis Skin - normal coloration and turgor, no rashes, no suspicious skin lesions noted   Assessment and Plan:  Elevated glucose - Plan: Hemoglobin A1c  Encounter for hepatitis C screening test for low risk patient - Plan: Hepatitis C antibody, Hepatic function panel  Elevated bilirubin - Plan: Hepatic function panel  Screening for deficiency anemia - Plan: CBC  Osteopenia, unspecified location -  Plan: Vitamin D (25 hydroxy)  Environmental allergies - Plan: levocetirizine (XYZAL) 5 MG tablet, budesonide-formoterol (SYMBICORT) 80-4.5 MCG/ACT inhaler  Hyperlipidemia, unspecified hyperlipidemia type - Plan: atorvastatin (LIPITOR) 40 MG tablet  Here today for a CPE She is overall doing quite well Refills and labs as above She had pneumovax twice but has not had prevnar.  Will wait a while prior to giving prevnar   Patient instructions:  It was great to see you today- continue to take good care of yourself!  I will be in touch with your labs .  Let's plan to visit in about 6 months  BP Readings from Last 3 Encounters:  01/17/16 126/74  12/28/15 122/66  10/12/15 120/86   Wt Readings from Last 3 Encounters:  04/09/16 168 lb 12.8 oz (76.6 kg)  01/17/16 165 lb 6 oz (75 kg)  12/28/15 165 lb 3.2 oz (74.9 kg)   Your blood pressure looks great! Stay active and try to keep your weight stable     Signed Lamar Blinks, MD

## 2016-04-09 NOTE — Telephone Encounter (Signed)
Caller name: Karista Relation to pt: self Call back number: [phone] [facility]  Reason:  Pt was seen today 04-09-2016 and stated the med for budesonide-formoterol (SYMBICORT) 80-4.5 MCG/ACT inhaler was really expensive ($90.00), pt wanted to know if she can get something else since that Symbicort is expensive. Please advise.

## 2016-04-09 NOTE — Patient Instructions (Signed)
It was great to see you today- continue to take good care of yourself!  I will be in touch with your labs .  Let's plan to visit in about 6 months  BP Readings from Last 3 Encounters:  01/17/16 126/74  12/28/15 122/66  10/12/15 120/86   Wt Readings from Last 3 Encounters:  04/09/16 168 lb 12.8 oz (76.6 kg)  01/17/16 165 lb 6 oz (75 kg)  12/28/15 165 lb 3.2 oz (74.9 kg)   Your blood pressure looks great! Stay active and try to keep your weight stable

## 2016-04-16 MED FILL — LEVOCETIRIZINE 5 MG TABLET: 5 | 90 days supply | Qty: 90 | Fill #1

## 2016-04-18 MED FILL — ALENDRONATE NA 70 MG TAB: 70 | 84 days supply | Qty: 12 | Fill #3

## 2016-04-23 MED FILL — SYMBICORT 80-4.5 MCG INH: 80-4.5 | 30 days supply | Qty: 10 | Fill #0

## 2016-04-23 MED FILL — ATORVASTATIN 40 MG TABLET: 40 | 90 days supply | Qty: 90 | Fill #0

## 2016-05-22 MED FILL — AZELASTINE 0.1% (137 MCG) S: 0.1 | 30 days supply | Qty: 30 | Fill #5

## 2016-05-22 MED FILL — CLINDAMYCIN HCL 300 MG CAPS: 300 | 1 days supply | Qty: 3 | Fill #1

## 2016-05-28 ENCOUNTER — Ambulatory Visit (INDEPENDENT_AMBULATORY_CARE_PROVIDER_SITE_OTHER): Payer: PPO | Admitting: Family Medicine

## 2016-05-28 VITALS — BP 122/75 | HR 68 | Temp 97.7°F | Ht 67.0 in | Wt 167.2 lb

## 2016-05-28 DIAGNOSIS — H9202 Otalgia, left ear: Secondary | ICD-10-CM | POA: Diagnosis not present

## 2016-05-28 LAB — POCT RAPID STREP A (OFFICE): RAPID STREP A SCREEN: NEGATIVE

## 2016-05-28 MED ORDER — PREDNISONE 20 MG PO TABS: ORAL_TABLET | ORAL | 0 refills | Status: DC

## 2016-05-28 MED FILL — predniSONE 20 MG TABS: 20 | 6 days supply | Qty: 9 | Fill #0

## 2016-05-28 NOTE — Patient Instructions (Addendum)
Your strep is negative- I think your ear is hurting due to congestion of your eustachian tube.  The prednisone should clear this up- however please let me know if not helpful or if you are getting worse

## 2016-05-28 NOTE — Progress Notes (Signed)
[facility] [address], [place], [place] [address] [phone]  Date:  05/28/2016   Name:  [name]   DOB:  11/28/1950   MRN:  [id number]  PCP:  Darreld Mclean, MD    Chief Complaint: Ear Pain (c/o left ear pain that started this morning. )   History of Present Illness:  [name] is a 66 y.o. very pleasant female patient who presents with the following:  Seen by myself in March for a PE- history of hyperlipidemia, GERD, HOH Here today with concern of LEFT ear pain- she notes it more when she swallows/.  First noted this morning.  Also if she feels her left sided neck glands they feel sore Right ear feels ok She has bilateral hearing aids.  Hearing is unchanged  She does not really notice any ST  Some runny nose, no sneezing No fever  She is on flonase, astelin, xyzal. She tried some benadryl this weekend She does tend ot have bad allergies esp in the spring  She has not noted any wheezing or difficulty breathing  Patient Active Problem List   Diagnosis Date Noted  . Routine history and physical examination of adult 04/06/2015  . Need for shingles vaccine 04/06/2015  . Environmental allergies 03/23/2015  . Hyperlipidemia 03/23/2015  . Gastro-esophageal reflux disease without esophagitis 02/04/2014  . 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 Osteopenia 07/20/2013    Past Medical History:  Diagnosis Date  . Environmental allergies   . Guillain Barr syndrome ([place])   . Hearing loss    Hearing Aids, bilateral  . High cholesterol   . History of chicken pox     Past Surgical History:  Procedure Laterality Date  . ABDOMINAL HYSTERECTOMY    . EYE SURGERY    . FOOT SURGERY    . KNEE ARTHROSCOPY    . MEDIAL PARTIAL KNEE REPLACEMENT  07-1998,08-1998    Social History  Substance Use Topics  . Smoking status: Former Research scientist (life sciences)  . Smokeless tobacco: Never Used  . Alcohol  use No    Family History  Problem Relation Age of Onset  . COPD Mother   . Heart disease Mother   . Hypertension Father   . Crohn's disease Sister   . Hypertension Brother   . Heart disease Brother   . Kidney disease Son   . Tuberculosis Maternal Grandmother   . Leukemia Maternal Grandfather     Allergies  Allergen Reactions  . Penicillins Anaphylaxis and Rash  . Darvon [Propoxyphene] Swelling  . Vicodin [Hydrocodone-Acetaminophen] Other (See Comments)    Unknown reaction  . Methocarbamol Rash    Medication list has been reviewed and updated.  Current Outpatient Prescriptions on File Prior to Visit  Medication Sig Dispense Refill  . albuterol (PROVENTIL HFA;VENTOLIN HFA) 108 (90 Base) MCG/ACT inhaler Inhale 2 puffs into the lungs every 6 (six) hours as needed for wheezing or shortness of breath. 1 Inhaler 0  . alendronate (FOSAMAX) 70 MG tablet Take 1 tablet (70 mg total) by mouth once a week. Take with a full glass of water on an empty stomach. 12 tablet 3  . Ascorbic Acid (VITAMIN C) 1000 MG tablet Take 1,000 mg by mouth daily.     Marland Kitchen atorvastatin (LIPITOR) 40 MG tablet Take 1 tablet (40 mg total) by mouth daily. 90 tablet 3  . azelastine (ASTELIN) 0.1 % nasal spray Place 2 sprays into both  nostrils 2 (two) times daily. Use in each nostril as directed 30 mL 12  . budesonide-formoterol (SYMBICORT) 80-4.5 MCG/ACT inhaler Inhale 2 puffs into the lungs 2 (two) times daily. 3 Inhaler 3  . calcium-vitamin D (CALCIUM 500+D HIGH POTENCY) 500-400 MG-UNIT tablet Take 2 tablets by mouth daily.     . fluticasone (FLONASE) 50 MCG/ACT nasal spray Place 2 sprays into both nostrils daily. 48 g 1  . levocetirizine (XYZAL) 5 MG tablet Take 1 tablet (5 mg total) by mouth every evening. 90 tablet 3  . Multiple Vitamin (MULTI-VITAMINS) TABS Take 1 tablet by mouth daily.    . Omega-3 Fatty Acids (FISH OIL) 1000 MG CAPS Take 1 capsule by mouth daily.    . tizanidine (ZANAFLEX) 2 MG capsule Take 1  capsule (2 mg total) by mouth 3 (three) times daily as needed for muscle spasms. 30 capsule 0   No current facility-administered medications on file prior to visit.     Review of Systems:  As per HPI- otherwise negative.   Physical Examination: Vitals:   05/28/16 1128  BP: 140/82  Pulse: 68  Temp: 97.7 F (36.5 C)   Vitals:   05/28/16 1128  Weight: 167 lb 3.2 oz (75.8 kg)  Height: 5\' 7"  (1.702 m)   Body mass index is 26.19 kg/m. Ideal Body Weight: Weight in (lb) to have BMI = 25: 159.3  GEN: WDWN, NAD, Non-toxic, A & O x 3, minimal overweight, looks well HEENT: Atraumatic, Normocephalic. Neck supple. No masses, No LAD.  Right TM wnl, left TM is dull and shows a clear fluid effusion, nasal cavity is congested, oropharynx erythematous but no exudate.  PEERL,EOMI.  No skin rash or vesicles  Ears and Nose: No external deformity. CV: RRR, No M/G/R. No JVD. No thrill. No extra heart sounds. PULM: CTA B, no wheezes, crackles, rhonchi. No retractions. No resp. distress. No accessory muscle use. EXTR: No c/c/e NEURO Normal gait.  PSYCH: Normally interactive. Conversant. Not depressed or anxious appearing.  Calm demeanor.   Results for orders placed or performed in visit on 05/28/16  POCT rapid strep A  Result Value Ref Range   Rapid Strep A Screen Negative Negative    Assessment and Plan: Left ear pain - Plan: POCT rapid strep A, predniSONE (DELTASONE) 20 MG tablet  Here today with left ear pain.  Suspect due to allergy related ETD .  Neg rapid strep Suggested that we observe for a few days as this may resolve spontaneously. However pt is eager to try sometime to relieve her ear discomfort Will use a short course of prednisone for her, and continue other allergy meds as per her usual.  Avoid NSAIDs while on prednisone.  She will alert me if not feeling better soon- Sooner if worse.   Meds ordered this encounter  Medications  . Cholecalciferol (VITAMIN D) 2000 units CAPS     Sig: Take 1 capsule by mouth daily.  . predniSONE (DELTASONE) 20 MG tablet    Sig: Take 2 pills a day for 3 days, then 1 pill a day for 3 days if needed    Dispense:  6 tablet    Refill:  0      Signed Lamar Blinks, MD

## 2016-07-02 ENCOUNTER — Other Ambulatory Visit: Payer: Self-pay | Admitting: Emergency Medicine

## 2016-07-02 ENCOUNTER — Encounter: Payer: Self-pay | Admitting: Family Medicine

## 2016-07-02 MED ORDER — ALENDRONATE SODIUM 70 MG PO TABS: 70.0000 mg | ORAL_TABLET | ORAL | 3 refills | Status: DC

## 2016-07-02 MED ORDER — FLUTICASONE PROPIONATE 50 MCG/ACT NA SUSP: 2.0000 | Freq: Every day | NASAL | 1 refills | Status: DC

## 2016-07-02 MED FILL — ALENDRONATE NA 70 MG TAB: 70 | 84 days supply | Qty: 12 | Fill #0

## 2016-07-02 MED FILL — FLUTICASONE PROP 50 MCG SPR: 50 | 84 days supply | Qty: 48 | Fill #0

## 2016-07-09 ENCOUNTER — Encounter: Payer: Self-pay | Admitting: Family Medicine

## 2016-07-09 ENCOUNTER — Ambulatory Visit (INDEPENDENT_AMBULATORY_CARE_PROVIDER_SITE_OTHER): Payer: PPO | Admitting: Family Medicine

## 2016-07-09 VITALS — BP 141/71 | HR 74 | Temp 98.0°F | Resp 16 | Wt 167.6 lb

## 2016-07-09 DIAGNOSIS — R6889 Other general symptoms and signs: Secondary | ICD-10-CM

## 2016-07-09 DIAGNOSIS — H578 Other specified disorders of eye and adnexa: Secondary | ICD-10-CM | POA: Diagnosis not present

## 2016-07-09 DIAGNOSIS — R0981 Nasal congestion: Secondary | ICD-10-CM | POA: Diagnosis not present

## 2016-07-09 MED ORDER — PREDNISONE 20 MG PO TABS: ORAL_TABLET | ORAL | 0 refills | Status: DC

## 2016-07-09 MED FILL — predniSONE 20 MG TABS: 20 | 6 days supply | Qty: 9 | Fill #0

## 2016-07-09 NOTE — Progress Notes (Signed)
[facility] [place], [place], [place] [address] [phone]  Date:  07/09/2016   Name:  [name]   DOB:  05-17-1950   MRN:  [id number]  PCP:  Darreld Mclean, MD    Chief Complaint: Sinus Problem (Pt is having facial pressure runny green mucus coughing runny nose. )   History of Present Illness:  [name] is a 66 y.o. very pleasant female patient who presents with the following:  Seen by myself for left ear pain about 6 weeks ago- treated with a short course of prednisone  Here today with nasal congestion and discomfort She has treid her xyzal and her nasal sprays that she takes normally - she is on astelin and flonase She has been outdoors a lot recently Last night she had a sinus headache No sneezing, occasional cough No fever noted No nausea, vomiting or diarrhea She does not have any history of glaucoma or diabetes  BP Readings from Last 3 Encounters:  07/09/16 (!) 141/71  05/28/16 122/75  01/17/16 126/74   Lab Results  Component Value Date   HGBA1C 5.7 04/09/2016     Patient Active Problem List   Diagnosis Date Noted  . Routine history and physical examination of adult 04/06/2015  . Need for shingles vaccine 04/06/2015  . Environmental allergies 03/23/2015  . Hyperlipidemia 03/23/2015  . Gastro-esophageal reflux disease without esophagitis 02/04/2014  . 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 Osteopenia 07/20/2013    Past Medical History:  Diagnosis Date  . Environmental allergies   . Guillain Barr syndrome ([place])   . Hearing loss    Hearing Aids, bilateral  . High cholesterol   . History of chicken pox     Past Surgical History:  Procedure Laterality Date  . ABDOMINAL HYSTERECTOMY    . EYE SURGERY    . FOOT SURGERY    . KNEE ARTHROSCOPY    . MEDIAL PARTIAL KNEE REPLACEMENT  07-1998,08-1998    Social History  Substance Use Topics  . Smoking  status: Former Research scientist (life sciences)  . Smokeless tobacco: Never Used  . Alcohol use No    Family History  Problem Relation Age of Onset  . COPD Mother   . Heart disease Mother   . Hypertension Father   . Crohn's disease Sister   . Hypertension Brother   . Heart disease Brother   . Kidney disease Son   . Tuberculosis Maternal Grandmother   . Leukemia Maternal Grandfather     Allergies  Allergen Reactions  . Penicillins Anaphylaxis and Rash  . Darvon [Propoxyphene] Swelling  . Vicodin [Hydrocodone-Acetaminophen] Other (See Comments)    Unknown reaction  . Methocarbamol Rash    Medication list has been reviewed and updated.  Current Outpatient Prescriptions on File Prior to Visit  Medication Sig Dispense Refill  . albuterol (PROVENTIL HFA;VENTOLIN HFA) 108 (90 Base) MCG/ACT inhaler Inhale 2 puffs into the lungs every 6 (six) hours as needed for wheezing or shortness of breath. 1 Inhaler 0  . alendronate (FOSAMAX) 70 MG tablet Take 1 tablet (70 mg total) by mouth once a week. Take with a full glass of water on an empty stomach. 12 tablet 3  . Ascorbic Acid (VITAMIN C) 1000 MG tablet Take 1,000 mg by mouth daily.     Marland Kitchen atorvastatin (LIPITOR) 40 MG tablet Take 1 tablet (40 mg total) by mouth daily. 90 tablet 3  . azelastine (ASTELIN)  0.1 % nasal spray Place 2 sprays into both nostrils 2 (two) times daily. Use in each nostril as directed 30 mL 12  . budesonide-formoterol (SYMBICORT) 80-4.5 MCG/ACT inhaler Inhale 2 puffs into the lungs 2 (two) times daily. 3 Inhaler 3  . calcium-vitamin D (CALCIUM 500+D HIGH POTENCY) 500-400 MG-UNIT tablet Take 2 tablets by mouth daily.     . Cholecalciferol (VITAMIN D) 2000 units CAPS Take 1 capsule by mouth daily.    . fluticasone (FLONASE) 50 MCG/ACT nasal spray Place 2 sprays into both nostrils daily. 48 g 1  . levocetirizine (XYZAL) 5 MG tablet Take 1 tablet (5 mg total) by mouth every evening. 90 tablet 3  . Multiple Vitamin (MULTI-VITAMINS) TABS Take 1  tablet by mouth daily.    . Omega-3 Fatty Acids (FISH OIL) 1000 MG CAPS Take 1 capsule by mouth daily.    . tizanidine (ZANAFLEX) 2 MG capsule Take 1 capsule (2 mg total) by mouth 3 (three) times daily as needed for muscle spasms. 30 capsule 0   No current facility-administered medications on file prior to visit.     Review of Systems:  As per HPI- otherwise negative. No CP or SOB No fever or chills No rash   Physical Examination: Vitals:   07/09/16 1005  BP: (!) 141/71  Pulse: 74  Resp: 16  Temp: 98 F (36.7 C)   Vitals:   07/09/16 1005  Weight: 167 lb 9.6 oz (76 kg)   Body mass index is 26.25 kg/m. Ideal Body Weight:    GEN: WDWN, NAD, Non-toxic, A & O x 3, looks well, minimal overweight HEENT: Atraumatic, Normocephalic. Neck supple. No masses, No LAD.  Bilateral TM wnl, oropharynx normal.  PEERL,EOMI.   Ears and Nose: No external deformity. CV: RRR, No M/G/R. No JVD. No thrill. No extra heart sounds. PULM: CTA B, no wheezes, crackles, rhonchi. No retractions. No resp. distress. No accessory muscle use. ABD: S, NT, ND, +BS. No rebound. No HSM. EXTR: No c/c/e NEURO Normal gait.  PSYCH: Normally interactive. Conversant. Not depressed or anxious appearing.  Calm demeanor.  Nasal cavity is quite inflamed and swollen. Eyes are slightly injected   Assessment and Plan: Nasal congestion - Plan: predniSONE (DELTASONE) 20 MG tablet  Itchy eyes  Here today with severe nasal congestion which is really bothering her She is already on astelin, flonase and xyzal She did well with prednisone 6 weeks ago- otherwise she has not recently used pred any other times Will have her use a short course of prednisone and OTC afrin for a few days She will let me know if not feeling better soon Meds ordered this encounter  Medications  . predniSONE (DELTASONE) 20 MG tablet    Sig: Take 2 pills a day for 3 days, then 1 pill a day for 3 days if needed    Dispense:  6 tablet    Refill:  0      Signed Lamar Blinks, MD

## 2016-07-09 NOTE — Patient Instructions (Signed)
It appears that you are having severe nasal allergy symptoms!   Please use the prednisone as directed- if you are doing much better after 3 days you can stop this medication Use some afrin nasal spray (generic is also ok) for a few days - your nose is so swollen that this may not be able to penetrate. breathing some cold air or being upright (if you were laying down) can help to open your nose so the spray can get in.    Please let me know if you are not feeling better in the next few days- Sooner if worse.

## 2016-07-27 MED FILL — LEVOCETIRIZINE 5 MG TABLET: 5 | 90 days supply | Qty: 90 | Fill #2

## 2016-07-27 MED FILL — ATORVASTATIN 40 MG TABLET: 40 | 90 days supply | Qty: 90 | Fill #1

## 2016-09-27 MED FILL — ALENDRONATE NA 70 MG TAB: 70 | 84 days supply | Qty: 12 | Fill #1

## 2016-09-30 NOTE — Progress Notes (Addendum)
[place] [facility] [address], [place], [place] [address] [phone]  Date:  10/03/2016   Name:  [name]   DOB:  1950/05/27   MRN:  [id number]  PCP:  Darreld Mclean, MD    Chief Complaint: Follow-up (Pt here for 6 month f/u visit. Flu vaccine today. )   History of Present Illness:  [name] is a 66 y.o. very pleasant female patient who presents with the following:  Here today for a 6 month follow-up visit History of hyperlipidemia, elevated glucose with borderline A1c Needs a flu shot today- will give today  She did have the zostavax last year - we discussed shingrix. She will likley have this at some point later on   Her breathing has been fine  Needs a refill of her astelin, and her zanaflex which she uses for occasional muscle spasm in her upper gack  Her allergies have not been that bad this year She continues to use symbicort but not every day   Bone density done last year  She will get her mammogram done soon  She has lost about 20 lbs thorough weight watchers!   Wt Readings from Last 3 Encounters:  10/03/16 145 lb 9.6 oz (66 kg)  07/09/16 167 lb 9.6 oz (76 kg)  05/28/16 167 lb 3.2 oz (75.8 kg)   She is walking a lot for exercise as well  She is fasting today for labs  Lab Results  Component Value Date   HGBA1C 5.7 04/09/2016   Lipids:    Component Value Date/Time   CHOL 124 10/12/2015 1009   TRIG 178.0 (H) 10/12/2015 1009   HDL 45.10 10/12/2015 1009   VLDL 35.6 10/12/2015 1009   CHOLHDL 3 10/12/2015 1009     Patient Active Problem List   Diagnosis Date Noted  . Routine history and physical examination of adult 04/06/2015  . Need for shingles vaccine 04/06/2015  . Environmental allergies 03/23/2015  . Hyperlipidemia 03/23/2015  . Gastro-esophageal reflux disease without esophagitis 02/04/2014  . 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 Osteopenia  07/20/2013    Past Medical History:  Diagnosis Date  . Environmental allergies   . Guillain Barr syndrome ([place])   . Hearing loss    Hearing Aids, bilateral  . High cholesterol   . History of chicken pox     Past Surgical History:  Procedure Laterality Date  . ABDOMINAL HYSTERECTOMY    . EYE SURGERY    . FOOT SURGERY    . KNEE ARTHROSCOPY    . MEDIAL PARTIAL KNEE REPLACEMENT  07-1998,08-1998    Social History  Substance Use Topics  . Smoking status: Former Research scientist (life sciences)  . Smokeless tobacco: Never Used  . Alcohol use No    Family History  Problem Relation Age of Onset  . COPD Mother   . Heart disease Mother   . Hypertension Father   . Crohn's disease Sister   . Hypertension Brother   . Heart disease Brother   . Kidney disease Son   . Tuberculosis Maternal Grandmother   . Leukemia Maternal Grandfather     Allergies  Allergen Reactions  . Penicillins Anaphylaxis and Rash  . Darvon [Propoxyphene] Swelling  . Vicodin [Hydrocodone-Acetaminophen] Other (See Comments)    Unknown reaction  . Methocarbamol Rash    Medication list has been reviewed and updated.  Current Outpatient Prescriptions on File Prior to Visit  Medication Sig Dispense  Refill  . albuterol (PROVENTIL HFA;VENTOLIN HFA) 108 (90 Base) MCG/ACT inhaler Inhale 2 puffs into the lungs every 6 (six) hours as needed for wheezing or shortness of breath. 1 Inhaler 0  . alendronate (FOSAMAX) 70 MG tablet Take 1 tablet (70 mg total) by mouth once a week. Take with a full glass of water on an empty stomach. 12 tablet 3  . Ascorbic Acid (VITAMIN C) 1000 MG tablet Take 1,000 mg by mouth daily.     Marland Kitchen atorvastatin (LIPITOR) 40 MG tablet Take 1 tablet (40 mg total) by mouth daily. 90 tablet 3  . azelastine (ASTELIN) 0.1 % nasal spray Place 2 sprays into both nostrils 2 (two) times daily. Use in each nostril as directed 30 mL 12  . budesonide-formoterol (SYMBICORT) 80-4.5 MCG/ACT inhaler Inhale 2 puffs into the lungs 2 (two)  times daily. 3 Inhaler 3  . calcium-vitamin D (CALCIUM 500+D HIGH POTENCY) 500-400 MG-UNIT tablet Take 2 tablets by mouth daily.     . Cholecalciferol (VITAMIN D) 2000 units CAPS Take 1 capsule by mouth daily.    . fluticasone (FLONASE) 50 MCG/ACT nasal spray Place 2 sprays into both nostrils daily. 48 g 1  . levocetirizine (XYZAL) 5 MG tablet Take 1 tablet (5 mg total) by mouth every evening. 90 tablet 3  . Multiple Vitamin (MULTI-VITAMINS) TABS Take 1 tablet by mouth daily.    . Omega-3 Fatty Acids (FISH OIL) 1000 MG CAPS Take 1 capsule by mouth daily.    . tizanidine (ZANAFLEX) 2 MG capsule Take 1 capsule (2 mg total) by mouth 3 (three) times daily as needed for muscle spasms. 30 capsule 0   No current facility-administered medications on file prior to visit.     Review of Systems:  As per HPI- otherwise negative. No fever or chills Some throat mucus/ congestion with allergies No nausea, vomiting or diarrhea  Positive for intentional weight loss    Physical Examination: Vitals:   10/03/16 0911  BP: 129/76  Pulse: 61  Temp: 97.7 F (36.5 C)  SpO2: 99%   Vitals:   10/03/16 0911  Weight: 145 lb 9.6 oz (66 kg)  Height: 5\' 7"  (1.702 m)   Body mass index is 22.8 kg/m. Ideal Body Weight: Weight in (lb) to have BMI = 25: 159.3  GEN: WDWN, NAD, Non-toxic, A & O x 3, normal weight, looks well Wears hearing aids bilaterally  HEENT: Atraumatic, Normocephalic. Neck supple. No masses, No LAD.  Bilateral TM wnl, oropharynx normal.  PEERL,EOMI.   Ears and Nose: No external deformity. CV: RRR, No M/G/R. No JVD. No thrill. No extra heart sounds. PULM: CTA B, no wheezes, crackles, rhonchi. No retractions. No resp. distress. No accessory muscle use. ABD: S, NT, ND, +BS. No rebound. No HSM. EXTR: No c/c/e NEURO Normal gait.  PSYCH: Normally interactive. Conversant. Not depressed or anxious appearing.  Calm demeanor.    Assessment and Plan: Mixed hyperlipidemia - Plan: Lipid  panel  Immunization due - Plan: Flu vaccine HIGH DOSE PF (Fluzone High dose)  Environmental allergies - Plan: azelastine (ASTELIN) 0.1 % nasal spray, budesonide-formoterol (SYMBICORT) 80-4.5 MCG/ACT inhaler  Screening for diabetes mellitus - Plan: Hemoglobin A1c, Comprehensive metabolic panel  Screening for deficiency anemia - Plan: CBC  Weight loss  Muscle spasm - Plan: tizanidine (ZANAFLEX) 2 MG capsule  Follow-up visit today Refilled medications for her Labs pending as above congratulated on her weight loss!  She does not plan to lose any more as she is now at  an ideal weight   Signed Lamar Blinks, MD  Received her labs 9/13  Results for orders placed or performed in visit on 10/03/16  Lipid panel  Result Value Ref Range   Cholesterol 111 0 - 200 mg/dL   Triglycerides 88.0 0.0 - 149.0 mg/dL   HDL 56.50 >39.00 mg/dL   VLDL 17.6 0.0 - 40.0 mg/dL   LDL Cholesterol 36 0 - 99 mg/dL   Total CHOL/HDL Ratio 2    NonHDL 54.06   Hemoglobin A1c  Result Value Ref Range   Hgb A1c MFr Bld 5.4 4.6 - 6.5 %  CBC  Result Value Ref Range   WBC 3.4 (L) 4.0 - 10.5 K/uL   RBC 4.64 3.87 - 5.11 Mil/uL   Platelets 154.0 150.0 - 400.0 K/uL   Hemoglobin 13.6 12.0 - 15.0 g/dL   HCT 40.8 36.0 - 46.0 %   MCV 88.0 78.0 - 100.0 fl   MCHC 33.4 30.0 - 36.0 g/dL   RDW 13.2 11.5 - 15.5 %  Comprehensive metabolic panel  Result Value Ref Range   Sodium 140 135 - 145 mEq/L   Potassium 3.6 3.5 - 5.1 mEq/L   Chloride 104 96 - 112 mEq/L   CO2 29 19 - 32 mEq/L   Glucose, Bld 92 70 - 99 mg/dL   BUN 14 6 - 23 mg/dL   Creatinine, Ser 0.77 0.40 - 1.20 mg/dL   Total Bilirubin 2.5 (H) 0.2 - 1.2 mg/dL   Alkaline Phosphatase 57 39 - 117 U/L   AST 26 0 - 37 U/L   ALT 28 0 - 35 U/L   Total Protein 6.9 6.0 - 8.3 g/dL   Albumin 4.2 3.5 - 5.2 g/dL   Calcium 9.9 8.4 - 10.5 mg/dL   GFR 79.43 >60.00 mL/min

## 2016-10-01 DIAGNOSIS — H903 Sensorineural hearing loss, bilateral: Secondary | ICD-10-CM | POA: Diagnosis not present

## 2016-10-03 ENCOUNTER — Ambulatory Visit (INDEPENDENT_AMBULATORY_CARE_PROVIDER_SITE_OTHER): Payer: PPO | Admitting: Family Medicine

## 2016-10-03 VITALS — BP 129/76 | HR 61 | Temp 97.7°F | Ht 67.0 in | Wt 145.6 lb

## 2016-10-03 DIAGNOSIS — Z130 Encounter for screening for diseases of the blood and blood-forming organs and certain disorders involving the immune mechanism: Secondary | ICD-10-CM

## 2016-10-03 DIAGNOSIS — M62838 Other muscle spasm: Secondary | ICD-10-CM

## 2016-10-03 DIAGNOSIS — E782 Mixed hyperlipidemia: Secondary | ICD-10-CM

## 2016-10-03 DIAGNOSIS — Z9109 Other allergy status, other than to drugs and biological substances: Secondary | ICD-10-CM | POA: Diagnosis not present

## 2016-10-03 DIAGNOSIS — Z23 Encounter for immunization: Secondary | ICD-10-CM

## 2016-10-03 DIAGNOSIS — R634 Abnormal weight loss: Secondary | ICD-10-CM

## 2016-10-03 DIAGNOSIS — Z131 Encounter for screening for diabetes mellitus: Secondary | ICD-10-CM | POA: Diagnosis not present

## 2016-10-03 LAB — CBC
HEMATOCRIT: 40.8 % (ref 36.0–46.0)
Hemoglobin: 13.6 g/dL (ref 12.0–15.0)
MCHC: 33.4 g/dL (ref 30.0–36.0)
MCV: 88 fl (ref 78.0–100.0)
Platelets: 154 10*3/uL (ref 150.0–400.0)
RBC: 4.64 Mil/uL (ref 3.87–5.11)
RDW: 13.2 % (ref 11.5–15.5)
WBC: 3.4 10*3/uL — ABNORMAL LOW (ref 4.0–10.5)

## 2016-10-03 LAB — LIPID PANEL
CHOL/HDL RATIO: 2
Cholesterol: 111 mg/dL (ref 0–200)
HDL: 56.5 mg/dL (ref >39.00)
LDL CALC: 36 mg/dL (ref 0–99)
NonHDL: 54.06
Triglycerides: 88 mg/dL (ref 0.0–149.0)
VLDL: 17.6 mg/dL (ref 0.0–40.0)

## 2016-10-03 LAB — COMPREHENSIVE METABOLIC PANEL
ALT: 28 U/L (ref 0–35)
AST: 26 U/L (ref 0–37)
Albumin: 4.2 g/dL (ref 3.5–5.2)
Alkaline Phosphatase: 57 U/L (ref 39–117)
BUN: 14 mg/dL (ref 6–23)
CHLORIDE: 104 meq/L (ref 96–112)
CO2: 29 mEq/L (ref 19–32)
Calcium: 9.9 mg/dL (ref 8.4–10.5)
Creatinine, Ser: 0.77 mg/dL (ref 0.40–1.20)
GFR: 79.43 mL/min (ref >60.00)
GLUCOSE: 92 mg/dL (ref 70–99)
Potassium: 3.6 mEq/L (ref 3.5–5.1)
SODIUM: 140 meq/L (ref 135–145)
Total Bilirubin: 2.5 mg/dL — ABNORMAL HIGH (ref 0.2–1.2)
Total Protein: 6.9 g/dL (ref 6.0–8.3)

## 2016-10-03 LAB — HEMOGLOBIN A1C: Hgb A1c MFr Bld: 5.4 % (ref 4.6–6.5)

## 2016-10-03 MED ORDER — BUDESONIDE-FORMOTEROL FUMARATE 80-4.5 MCG/ACT IN AERO: 2.0000 | INHALATION_SPRAY | Freq: Two times a day (BID) | RESPIRATORY_TRACT | 6 refills | Status: DC

## 2016-10-03 MED ORDER — TIZANIDINE HCL 2 MG PO CAPS: 2.0000 mg | ORAL_CAPSULE | Freq: Three times a day (TID) | ORAL | 1 refills | Status: DC | PRN

## 2016-10-03 MED ORDER — AZELASTINE HCL 0.1 % NA SOLN: 2.0000 | Freq: Two times a day (BID) | NASAL | 12 refills | Status: DC

## 2016-10-03 MED FILL — SYMBICORT 80-4.5 MCG INH: 80-4.5 | 30 days supply | Qty: 10 | Fill #0

## 2016-10-03 MED FILL — tiZANidine HCL 2 MG TABS: 2 | 10 days supply | Qty: 30 | Fill #0

## 2016-10-03 MED FILL — AZELASTINE 0.1% (137 MCG) S: 0.1 | 30 days supply | Qty: 30 | Fill #0

## 2016-10-03 NOTE — Patient Instructions (Addendum)
Congrats on your weight loss!  Great job!!    We refilled your medications as below today, and I will be in touch with your labs Do be sure to get you mammogram in the next 6 months or so We can plan to repeat your bone density next year   Please see me for a physical in about 6 months

## 2016-10-04 ENCOUNTER — Encounter: Payer: Self-pay | Admitting: Family Medicine

## 2016-10-04 DIAGNOSIS — H16212 Exposure keratoconjunctivitis, left eye: Secondary | ICD-10-CM | POA: Diagnosis not present

## 2016-10-04 NOTE — Addendum Note (Signed)
Addended by: Lamar Blinks C on: 10/04/2016 05:15 AM   Modules accepted: Orders

## 2016-10-15 ENCOUNTER — Ambulatory Visit (HOSPITAL_BASED_OUTPATIENT_CLINIC_OR_DEPARTMENT_OTHER)
Admission: RE | Admit: 2016-10-15 | Discharge: 2016-10-15 | Disposition: A | Discharge status: Home / Self Care | Payer: PPO | Source: Ambulatory Visit | Attending: Family Medicine | Admitting: Family Medicine

## 2016-10-19 ENCOUNTER — Ambulatory Visit (INDEPENDENT_AMBULATORY_CARE_PROVIDER_SITE_OTHER): Payer: PPO | Admitting: Medical

## 2016-10-19 DIAGNOSIS — J3089 Other allergic rhinitis: Secondary | ICD-10-CM | POA: Diagnosis not present

## 2016-10-19 DIAGNOSIS — H6690 Otitis media, unspecified, unspecified ear: Secondary | ICD-10-CM | POA: Diagnosis not present

## 2016-10-19 DIAGNOSIS — R0981 Nasal congestion: Secondary | ICD-10-CM

## 2016-10-19 MED ORDER — AZITHROMYCIN 250 MG PO TABS: ORAL_TABLET | ORAL | 0 refills | Status: DC

## 2016-10-19 MED FILL — AZITHROMYCIN 250 MG TABLET: 250 | 5 days supply | Qty: 6 | Fill #0

## 2016-10-19 NOTE — Patient Instructions (Signed)
You do appear on exam to have right side early ear infection. We'll go ahead and prescribed azithromycin antibiotic. For your history of allergies continue Xyzal, Flonase and Astelin.   If your symptoms persist or worsen please notify us/return.  On follow-up would need to evaluate the tympanic membrane and see if it looks normal for worsen.  If on recheck  Tympanic membrane looks better but symptoms persisted then consider Depo-Medrol injection.     Follow-up in 7 days or as needed.   Note the minimal amount of wax present today on right ear canal check I think is insignificant.

## 2016-10-19 NOTE — Progress Notes (Signed)
Subjective:    Patient ID: [name], female    DOB: 1950-01-29, 66 y.o.   MRN: [id number]  HPI  Pt in with some moderate rt ear pain since yesterday morning. Some pain beneath rt ear. Pt states over 6 years lost hearing in that ear. She saw specialist at the time she was loosing hearing. Pt sees audiologist regularly. Last ent about 1.5 years ago.  Pt can feel pnd recently and using zyxal, astelin, and flonase.  No fever, no chills, no sweats or dizziness.   Pt has been having to clear her throat.    Review of Systems  Constitutional: Negative for chills, fatigue and fever.  HENT: Positive for congestion, ear pain and postnasal drip. Negative for sinus pain and sneezing.   Respiratory: Negative for cough, chest tightness and wheezing.   Cardiovascular: Negative for chest pain and palpitations.  Gastrointestinal: Negative for abdominal pain.  Musculoskeletal: Negative for back pain, joint swelling, myalgias and neck stiffness.  Skin: Negative for rash.  Neurological: Negative for dizziness, speech difficulty, weakness and headaches.  Hematological: Negative for adenopathy. Does not bruise/bleed easily.  Psychiatric/Behavioral: Negative for behavioral problems and confusion. The patient is not nervous/anxious.    Past Medical History:  Diagnosis Date  . Environmental allergies   . Guillain Barr syndrome ([place])   . Hearing loss    Hearing Aids, bilateral  . High cholesterol   . History of chicken pox      Social History   Social History  . Marital status: Married    Spouse name: N/A  . Number of children: N/A  . Years of education: N/A   Occupational History  . Not on file.   Social History Main Topics  . Smoking status: Former Research scientist (life sciences)  . Smokeless tobacco: Never Used  . Alcohol use No  . Drug use: No  . Sexual activity: Not on file   Other Topics Concern  . Not on file   Social History Narrative  . No narrative on file    Past Surgical History:    Procedure Laterality Date  . ABDOMINAL HYSTERECTOMY    . EYE SURGERY    . FOOT SURGERY    . KNEE ARTHROSCOPY    . MEDIAL PARTIAL KNEE REPLACEMENT  07-1998,08-1998    Family History  Problem Relation Age of Onset  . COPD Mother   . Heart disease Mother   . Hypertension Father   . Crohn's disease Sister   . Hypertension Brother   . Heart disease Brother   . Kidney disease Son   . Tuberculosis Maternal Grandmother   . Leukemia Maternal Grandfather     Allergies  Allergen Reactions  . Penicillins Anaphylaxis and Rash  . Darvon [Propoxyphene] Swelling  . Vicodin [Hydrocodone-Acetaminophen] Other (See Comments)    Unknown reaction  . Methocarbamol Rash    Current Outpatient Prescriptions on File Prior to Visit  Medication Sig Dispense Refill  . albuterol (PROVENTIL HFA;VENTOLIN HFA) 108 (90 Base) MCG/ACT inhaler Inhale 2 puffs into the lungs every 6 (six) hours as needed for wheezing or shortness of breath. 1 Inhaler 0  . alendronate (FOSAMAX) 70 MG tablet Take 1 tablet (70 mg total) by mouth once a week. Take with a full glass of water on an empty stomach. 12 tablet 3  . Ascorbic Acid (VITAMIN C) 1000 MG tablet Take 1,000 mg by mouth daily.     Marland Kitchen atorvastatin (LIPITOR) 40 MG tablet Take 1 tablet (40 mg total) by mouth  daily. 90 tablet 3  . azelastine (ASTELIN) 0.1 % nasal spray Place 2 sprays into both nostrils 2 (two) times daily. Use in each nostril as directed 30 mL 12  . budesonide-formoterol (SYMBICORT) 80-4.5 MCG/ACT inhaler Inhale 2 puffs into the lungs 2 (two) times daily. 3 Inhaler 6  . calcium-vitamin D (CALCIUM 500+D HIGH POTENCY) 500-400 MG-UNIT tablet Take 2 tablets by mouth daily.     . Cholecalciferol (VITAMIN D) 2000 units CAPS Take 1 capsule by mouth daily.    . fluticasone (FLONASE) 50 MCG/ACT nasal spray Place 2 sprays into both nostrils daily. 48 g 1  . levocetirizine (XYZAL) 5 MG tablet Take 1 tablet (5 mg total) by mouth every evening. 90 tablet 3  .  Multiple Vitamin (MULTI-VITAMINS) TABS Take 1 tablet by mouth daily.    . Omega-3 Fatty Acids (FISH OIL) 1000 MG CAPS Take 1 capsule by mouth daily.    . tizanidine (ZANAFLEX) 2 MG capsule Take 1 capsule (2 mg total) by mouth 3 (three) times daily as needed for muscle spasms. 30 capsule 1   No current facility-administered medications on file prior to visit.     There were no vitals taken for this visit.      Objective:   Physical Exam  General  Mental Status - Alert. General Appearance - Well groomed. Not in acute distress.  Skin Rashes- No Rashes.  HEENT Head- Normal. Ear Auditory Canal - Left- Normal. Right - small piece wax (but 95% clear)Tympanic Membrane- rt- mild dull pinkish appearance to rt tm  lt- Normal.   Note no tragal tenderness, no preauricular pain. Eye Sclera/Conjunctiva- Left- Normal. Right- Normal. Nose & Sinuses Nasal Mucosa- Left-  Boggy and Congested. Right-  Boggy and  Congested.Bilateral no maxillary and  nofrontal sinus pressure. Mouth & Throat Lips: Upper Lip- Normal: no dryness, cracking, pallor, cyanosis, or vesicular eruption. Lower Lip-Normal: no dryness, cracking, pallor, cyanosis or vesicular eruption. Buccal Mucosa- Bilateral- No Aphthous ulcers. Oropharynx- No Discharge or Erythema. +pnd Tonsils: Characteristics- Bilateral- No Erythema or Congestion. Size/Enlargement- Bilateral- No enlargement. Discharge- bilateral-None.  Neck Neck- Supple. No Masses.(faint enlarge rt submandibular node)   Chest and Lung Exam Auscultation: Breath Sounds:-Clear even and unlabored.  Cardiovascular Auscultation:Rythm- Regular, rate and rhythm. Murmurs & Other Heart Sounds:Ausculatation of the heart reveal- No Murmurs.  Lymphatic Head & Neck General Head & Neck Lymphatics: Bilateral: Description- No Localized lymphadenopathy.       Assessment & Plan:  You do appear on exam to have right side early ear infection. We'll go ahead and prescribed  azithromycin antibiotic. For your history of allergies continue Xyzal, Flonase and Astelin.   If your symptoms persist or worsen please notify us/return.  On follow-up would need to evaluate the tympanic membrane and see if it looks normal for worsen.  If on recheck  Tympanic membrane looks better but symptoms persisted then consider Depo-Medrol injection.     Follow-up in 7 days or as needed.   Note the minimal amount of wax present today on right ear canal check I think is insignificant.  Latrina Guttman, Percell Miller, PA-C

## 2016-10-31 MED FILL — ATORVASTATIN 40 MG TABLET: 40 | 90 days supply | Qty: 90 | Fill #2

## 2016-10-31 MED FILL — LEVOCETIRIZINE 5 MG TABLET: 5 | 90 days supply | Qty: 90 | Fill #3

## 2016-11-02 ENCOUNTER — Encounter: Payer: Self-pay | Admitting: Medical

## 2016-11-02 ENCOUNTER — Ambulatory Visit (INDEPENDENT_AMBULATORY_CARE_PROVIDER_SITE_OTHER): Payer: PPO | Admitting: Medical

## 2016-11-02 VITALS — BP 140/79 | HR 64 | Temp 97.7°F | Resp 16 | Ht 67.0 in | Wt 143.0 lb

## 2016-11-02 DIAGNOSIS — T7840XA Allergy, unspecified, initial encounter: Secondary | ICD-10-CM

## 2016-11-02 DIAGNOSIS — R21 Rash and other nonspecific skin eruption: Secondary | ICD-10-CM

## 2016-11-02 MED ORDER — METHYLPREDNISOLONE ACETATE 40 MG/ML IJ SUSP
40.0000 mg | Freq: Once | INTRAMUSCULAR | Status: AC
  Administered 2016-11-02: 40 mg via INTRAMUSCULAR

## 2016-11-02 MED ORDER — HYDROXYZINE HCL 10 MG PO TABS: 10.0000 mg | ORAL_TABLET | Freq: Three times a day (TID) | ORAL | 0 refills | Status: DC | PRN

## 2016-11-02 MED ORDER — PREDNISONE 10 MG PO TABS: ORAL_TABLET | ORAL | 0 refills | Status: DC

## 2016-11-02 MED ORDER — FAMCICLOVIR 500 MG PO TABS: 500.0000 mg | ORAL_TABLET | Freq: Three times a day (TID) | ORAL | 0 refills | Status: DC

## 2016-11-02 MED FILL — predniSONE 10 MG TABS: 10 | 6 days supply | Qty: 21 | Fill #0

## 2016-11-02 MED FILL — hydrOXYzine HCL 10 MG TABS: 10 | 10 days supply | Qty: 30 | Fill #0

## 2016-11-02 NOTE — Progress Notes (Signed)
Subjective:    Patient ID: [name], female    DOB: 15-Sep-1950, 66 y.o.   MRN: [id number]  HPI  Pt in with intermittent history of intermittent mild hive appearance. Areas do itch a lot.  Pt denies hx of skin reactions, allergies or sensitivity.   Pt daughter in law was visiting and she did use new detergent but then she washed clothes and then used new detergent.   Pt has rash on forearms, knees, back and buttox.   Rash present for one week. Still getting new area of itching.  Pt has recent rash that itches. Reports rash occurred after no known particular exposure. On review pt does not report any suspicious exposure to soaps, creams, make up, lotions, animal exposure exposure, plants or insect bites.  Pt rash is in the area of. Pt reports no shortness of breath or wheezing. . Pt is not diabetic.  Pt did just purex for first time.  New rash area to medial scapula but she had pain about one month. Has been seeing chiropracter.    Review of Systems  Constitutional: Negative for chills and fatigue.  Respiratory: Negative for cough, chest tightness, shortness of breath and wheezing.   Cardiovascular: Negative for chest pain and palpitations.  Gastrointestinal: Negative for abdominal pain.  Musculoskeletal: Negative for neck pain.  Skin: Positive for rash.  Neurological: Negative for dizziness, weakness and headaches.  Hematological: Negative for adenopathy. Does not bruise/bleed easily.  Psychiatric/Behavioral: Negative for behavioral problems and confusion.   Past Medical History:  Diagnosis Date  . Environmental allergies   . Guillain Barr syndrome ([place])   . Hearing loss    Hearing Aids, bilateral  . High cholesterol   . History of chicken pox      Social History   Social History  . Marital status: Married    Spouse name: N/A  . Number of children: N/A  . Years of education: N/A   Occupational History  . Not on file.   Social History Main Topics  .  Smoking status: Former Research scientist (life sciences)  . Smokeless tobacco: Never Used  . Alcohol use No  . Drug use: No  . Sexual activity: Not on file   Other Topics Concern  . Not on file   Social History Narrative  . No narrative on file    Past Surgical History:  Procedure Laterality Date  . ABDOMINAL HYSTERECTOMY    . EYE SURGERY    . FOOT SURGERY    . KNEE ARTHROSCOPY    . MEDIAL PARTIAL KNEE REPLACEMENT  07-1998,08-1998    Family History  Problem Relation Age of Onset  . COPD Mother   . Heart disease Mother   . Hypertension Father   . Crohn's disease Sister   . Hypertension Brother   . Heart disease Brother   . Kidney disease Son   . Tuberculosis Maternal Grandmother   . Leukemia Maternal Grandfather     Allergies  Allergen Reactions  . Penicillins Anaphylaxis and Rash  . Darvon [Propoxyphene] Swelling  . Vicodin [Hydrocodone-Acetaminophen] Other (See Comments)    Unknown reaction  . Methocarbamol Rash    Current Outpatient Prescriptions on File Prior to Visit  Medication Sig Dispense Refill  . albuterol (PROVENTIL HFA;VENTOLIN HFA) 108 (90 Base) MCG/ACT inhaler Inhale 2 puffs into the lungs every 6 (six) hours as needed for wheezing or shortness of breath. 1 Inhaler 0  . alendronate (FOSAMAX) 70 MG tablet Take 1 tablet (70 mg total) by mouth  once a week. Take with a full glass of water on an empty stomach. 12 tablet 3  . Ascorbic Acid (VITAMIN C) 1000 MG tablet Take 1,000 mg by mouth daily.     Marland Kitchen atorvastatin (LIPITOR) 40 MG tablet Take 1 tablet (40 mg total) by mouth daily. 90 tablet 3  . azelastine (ASTELIN) 0.1 % nasal spray Place 2 sprays into both nostrils 2 (two) times daily. Use in each nostril as directed 30 mL 12  . azithromycin (ZITHROMAX) 250 MG tablet Take 2 tablets by mouth on day 1, followed by 1 tablet by mouth daily for 4 days. 6 tablet 0  . budesonide-formoterol (SYMBICORT) 80-4.5 MCG/ACT inhaler Inhale 2 puffs into the lungs 2 (two) times daily. 3 Inhaler 6  .  calcium-vitamin D (CALCIUM 500+D HIGH POTENCY) 500-400 MG-UNIT tablet Take 2 tablets by mouth daily.     . Cholecalciferol (VITAMIN D) 2000 units CAPS Take 1 capsule by mouth daily.    . fluticasone (FLONASE) 50 MCG/ACT nasal spray Place 2 sprays into both nostrils daily. 48 g 1  . levocetirizine (XYZAL) 5 MG tablet Take 1 tablet (5 mg total) by mouth every evening. 90 tablet 3  . Multiple Vitamin (MULTI-VITAMINS) TABS Take 1 tablet by mouth daily.    . Omega-3 Fatty Acids (FISH OIL) 1000 MG CAPS Take 1 capsule by mouth daily.    . tizanidine (ZANAFLEX) 2 MG capsule Take 1 capsule (2 mg total) by mouth 3 (three) times daily as needed for muscle spasms. 30 capsule 1   No current facility-administered medications on file prior to visit.     BP 140/79   Pulse 64   Temp 97.7 F (36.5 C) (Oral)   Resp 16   Ht 5\' 7"  (1.702 m)   Wt 143 lb (64.9 kg)   SpO2 100%   BMI 22.40 kg/m       Objective:   Physical Exam   General Mental Status- Alert. General Appearance- Not in acute distress.   Skin General: Scattered mild hive-like appearance to anterior portion of forearms medial knees and left side upper back.(Patient reports that appearance on her buttocks region but the area was examined.)  Neck Carotid Arteries- Normal color. Moisture- Normal Moisture. No carotid bruits. No JVD.  Chest and Lung Exam Auscultation: Breath Sounds:-Normal.  Cardiovascular Auscultation:Rythm- Regular. Murmurs & Other Heart Sounds:Auscultation of the heart reveals- No Murmurs.  Abdomen Inspection:-Inspeection Normal. Palpation/Percussion:Note:No mass. Palpation and Percussion of the abdomen reveal- Non Tender, Non Distended + BS, no rebound or guarding.    Neurologic Cranial Nerve exam:- CN III-XII intact(No nystagmus), symmetric smile. Strength:- 5/5 equal and symmetric strength both upper and lower extremities.     Assessment & Plan:  Your recent allergic reaction by history and appearance  may be from purex detergent you use. There are other potential causes and your reaction does not have to be a new exposure.   Depo-Medrol 40 mg IM injection given today. Prednisone taper dose given as well. Hydroxyzine prescription for itching. Rx advisement given.  Try to keep skin moist has dry skin can be more sensitive.  Note regarding your left scapular region, the rash does look similar to other areas. Probable allergic reaction as the other region. However the fact that you describe pain in the area for a month or so prior to rash makes me consider potential shingles. So I'm going to make a prescription of Famvir(printed Rx) available to start in the event that this area becomes painful or  itchy see any small blister eruption. In that event start Famvir immediately  Follow-up in 10 days or as needed.  Junko Ohagan, Percell Miller, PA-C

## 2016-11-02 NOTE — Addendum Note (Signed)
Addended by: Hinton Dyer on: 11/02/2016 03:08 PM   Modules accepted: Orders

## 2016-11-02 NOTE — Patient Instructions (Signed)
Your recent allergic reaction by history and appearance may be from purex detergent you use. There are other potential causes and your reaction does not have to be a new exposure.   Depo-Medrol 40 mg IM injection given today. Prednisone taper dose given as well. Hydroxyzine prescription for itching. Rx advisement given.  Try to keep skin moist has dry skin can be more sensitive.  Note regarding your left scapular region, the rash does look similar to other areas. Probable allergic reaction as the other region. However the fact that you describe pain in the area for a month or so prior to rash makes me consider potential shingles. So I'm going to make a prescription of Famvir(printed Rx) available to start in the event that this area becomes painful or itchy see any small blister eruption. In that event start Famvir immediately  Follow-up in 10 days or as needed.

## 2016-11-08 DIAGNOSIS — T7840XA Allergy, unspecified, initial encounter: Secondary | ICD-10-CM | POA: Diagnosis not present

## 2016-11-08 DIAGNOSIS — R21 Rash and other nonspecific skin eruption: Secondary | ICD-10-CM | POA: Diagnosis not present

## 2016-11-23 ENCOUNTER — Telehealth: Payer: Self-pay | Admitting: Family Medicine

## 2016-11-23 NOTE — Telephone Encounter (Signed)
Relation to HY:IFOY Call back [phone]   Reason for call:  Patient requesting referral to allergist due to hive breakout, please advise

## 2016-11-27 ENCOUNTER — Encounter: Payer: Self-pay | Admitting: Family Medicine

## 2016-11-27 DIAGNOSIS — L509 Urticaria, unspecified: Secondary | ICD-10-CM

## 2016-11-30 MED FILL — AZELASTINE 0.1% (137 MCG) S: 0.1 | 30 days supply | Qty: 30 | Fill #1

## 2016-11-30 MED FILL — FLUTICASONE PROP 50 MCG SPR: 50 | 84 days supply | Qty: 48 | Fill #1

## 2016-12-05 ENCOUNTER — Other Ambulatory Visit: Payer: Self-pay | Admitting: Medical

## 2016-12-05 MED ORDER — HYDROXYZINE HCL 10 MG PO TABS: 10.0000 mg | ORAL_TABLET | Freq: Three times a day (TID) | ORAL | 1 refills | Status: DC | PRN

## 2016-12-05 MED FILL — hydrOXYzine HCL 10 MG TABS: 10 | 10 days supply | Qty: 30 | Fill #0

## 2016-12-11 ENCOUNTER — Ambulatory Visit (INDEPENDENT_AMBULATORY_CARE_PROVIDER_SITE_OTHER): Payer: PPO | Admitting: Allergy and Immunology

## 2016-12-11 ENCOUNTER — Encounter: Payer: Self-pay | Admitting: Allergy and Immunology

## 2016-12-11 VITALS — BP 108/80 | HR 71 | Temp 97.9°F | Resp 16 | Ht 66.58 in | Wt 142.0 lb

## 2016-12-11 DIAGNOSIS — T783XXD Angioneurotic edema, subsequent encounter: Secondary | ICD-10-CM | POA: Diagnosis not present

## 2016-12-11 DIAGNOSIS — J9801 Acute bronchospasm: Secondary | ICD-10-CM | POA: Diagnosis not present

## 2016-12-11 DIAGNOSIS — L500 Allergic urticaria: Secondary | ICD-10-CM | POA: Diagnosis not present

## 2016-12-11 DIAGNOSIS — K2970 Gastritis, unspecified, without bleeding: Secondary | ICD-10-CM

## 2016-12-11 DIAGNOSIS — Z9109 Other allergy status, other than to drugs and biological substances: Secondary | ICD-10-CM | POA: Diagnosis not present

## 2016-12-11 DIAGNOSIS — T783XXA Angioneurotic edema, initial encounter: Secondary | ICD-10-CM | POA: Insufficient documentation

## 2016-12-11 DIAGNOSIS — J3089 Other allergic rhinitis: Secondary | ICD-10-CM

## 2016-12-11 DIAGNOSIS — J4520 Mild intermittent asthma, uncomplicated: Secondary | ICD-10-CM

## 2016-12-11 DIAGNOSIS — B9689 Other specified bacterial agents as the cause of diseases classified elsewhere: Secondary | ICD-10-CM

## 2016-12-11 DIAGNOSIS — J208 Acute bronchitis due to other specified organisms: Secondary | ICD-10-CM

## 2016-12-11 MED ORDER — LEVOCETIRIZINE DIHYDROCHLORIDE 5 MG PO TABS: 5.0000 mg | ORAL_TABLET | Freq: Every evening | ORAL | 5 refills | Status: DC

## 2016-12-11 MED ORDER — MONTELUKAST SODIUM 10 MG PO TABS: 10.0000 mg | ORAL_TABLET | Freq: Every day | ORAL | 5 refills | Status: DC

## 2016-12-11 MED ORDER — AZELASTINE HCL 0.1 % NA SOLN: NASAL | 5 refills | Status: DC

## 2016-12-11 MED ORDER — FLUTICASONE PROPIONATE 50 MCG/ACT NA SUSP: 2.0000 | Freq: Every day | NASAL | 2 refills | Status: DC

## 2016-12-11 MED ORDER — ALBUTEROL SULFATE HFA 108 (90 BASE) MCG/ACT IN AERS: 2.0000 | INHALATION_SPRAY | Freq: Four times a day (QID) | RESPIRATORY_TRACT | 0 refills | Status: AC | PRN

## 2016-12-11 MED ORDER — FLUTICASONE PROPIONATE HFA 110 MCG/ACT IN AERO: INHALATION_SPRAY | RESPIRATORY_TRACT | 5 refills | Status: AC

## 2016-12-11 MED ORDER — RANITIDINE HCL 150 MG PO TABS: ORAL_TABLET | ORAL | 5 refills | Status: DC

## 2016-12-11 MED FILL — PROAIR HFA 90 MCG INHALER: 108 (90 BAS | 25 days supply | Qty: 9 | Fill #0

## 2016-12-11 MED FILL — FLOVENT HFA 110 MCG INHALER: 110 | 30 days supply | Qty: 12 | Fill #0

## 2016-12-11 MED FILL — raNITIdine HCL 150 MG TABS: 150 | 30 days supply | Qty: 60 | Fill #0

## 2016-12-11 MED FILL — MONTELUKAST SOD 10 MG TAB: 10 | 30 days supply | Qty: 30 | Fill #0

## 2016-12-11 NOTE — Progress Notes (Signed)
New Patient Note  RE: [name] MRN: [id number] DOB: 08-03-1950 Date of Office Visit: 12/11/2016  Referring provider: Darreld Mclean, MD Primary care provider: Darreld Mclean, MD  Chief Complaint: Urticaria and Nasal Congestion   History of present illness: [name] is a 66 y.o. female seen today in consultation requested by Mary Blinks, MD.   Over the past 5 weeks, [name] has experienced recurrent episodes of hives. The hives have appeared at different times over her face, back, arms and legs.  The lesions are described as erythematous, raised, and pruritic.  Individual hives last less than 24 hours without leaving residual pigmentation or bruising. She experienced associated mild angioedema of the tongue on one occasion, but denies cardiopulmonary symptoms and GI symptoms. She has not experienced unexpected weight loss, recurrent fevers or drenching night sweats. No specific medication, food, skin care product, detergent, soap, or other environmental triggers have been identified. The symptoms do not seem to correlate with NSAIDs use or emotional stress. She did not have symptoms consistent with a respiratory tract infection at the time of symptom onset. [name] has tried to control symptoms with OTC and prescribed sedating antihistamines which have offered adequate temporary relief. She has been evaluated and treated in the emergency department for these symptoms and was prescribed an epinephrine autoinjector. Skin biopsy has not been performed. Recently, she has been experiencing hives almost daily. Review of systems is positive for occasional heartburn and hip pain.  She has had a partial arthroplasty of both knees. [name] experiences nasal congestion, rhinorrhea, sneezing, postnasal drainage, occasional sinus pressure, and occasional ocular pruritus.  No significant seasonal variation has been noted, however she does believe that her symptoms increase with strong  aromas. She reports that she was diagnosed with asthma approximately 2 years ago.  She had apparently 6 months of "constant coughing."  She was given albuterol without significant benefit and then was prescribed Symbicort with symptom relief.  She currently uses Symbicort 80-4.5 g as needed.   Assessment and plan: Recurrent urticaria Unclear etiology. Skin tests to select food allergens were negative today. NSAIDs and emotional stress commonly exacerbate urticaria but are not the underlying etiology in this case. Physical urticarias are negative by history (i.e. pressure-induced, temperature, vibration, solar, etc.). We will rule out other potential etiologies with labs. For symptom relief, patient is to take oral antihistamines as directed.  The following labs have been ordered: FCeRI antibody, TSH, anti-thyroglobulin antibody, thyroid peroxidase antibody, tryptase, urea breath test, CBC, CMP, ESR, ANA, and galactose-alpha-1,3-galactose IgE level.  The patient will be called with further recommendations after lab results have returned.  Instructions have been discussed and provided for H1/H2 receptor blockade with titration to find lowest effective dose.  Should there be a significant increase or change in symptoms, a journal is to be kept recording any foods eaten, beverages consumed, medications taken within a 6 hour period prior to the onset of symptoms, as well as record activities being performed, and environmental conditions. For any symptoms concerning for anaphylaxis, 911 is to be called immediately.  Angioedema Associated angioedema occurs in up to 50% of patients with chronic urticaria.  Treatment/diagnostic plan as outlined above.  Cough variant asthma  During respiratory tract infections or asthma flares, add Flovent 110g 2 inhalations 2 times per day until symptoms have returned to baseline.  To maximize pulmonary deposition, a spacer has been provided along with instructions  for its proper administration with an HFA inhaler.  Continue albuterol HFA,  1-2 inhalations every 4-6 hours if needed.  Perennial allergic rhinitis  Aeroallergen avoidance measures have been discussed and provided in written form.  Fexofenadine 180 mg daily if needed.  Montelukast has been prescribed (as above).  Continue azelastine and fluticasone nasal spray as needed.  Nasal saline spray (i.e., Simply Saline) or nasal saline lavage (i.e., NeilMed) is recommended as needed and prior to medicated nasal sprays.   Meds ordered this encounter  Medications  . ranitidine (ZANTAC) 150 MG tablet    Sig: One tablet one to two times a day as needed    Dispense:  60 tablet    Refill:  5  . fluticasone (FLOVENT HFA) 110 MCG/ACT inhaler    Sig: Two puffs with spacer twice a day during respiratory flares    Dispense:  1 Inhaler    Refill:  5  . albuterol (PROVENTIL HFA;VENTOLIN HFA) 108 (90 Base) MCG/ACT inhaler    Sig: Inhale 2 puffs into the lungs every 6 (six) hours as needed for wheezing or shortness of breath.    Dispense:  1 Inhaler    Refill:  0  . montelukast (SINGULAIR) 10 MG tablet    Sig: Take 1 tablet (10 mg total) by mouth at bedtime.    Dispense:  30 tablet    Refill:  5  . fluticasone (FLONASE) 50 MCG/ACT nasal spray    Sig: Place 2 sprays into both nostrils daily.    Dispense:  16 g    Refill:  2  . azelastine (ASTELIN) 0.1 % nasal spray    Sig: Two sprays each nostril twice a day as needed.    Dispense:  30 mL    Refill:  5  . levocetirizine (XYZAL) 5 MG tablet    Sig: Take 1 tablet (5 mg total) by mouth every evening.    Dispense:  30 tablet    Refill:  5  . levocetirizine (XYZAL) 5 MG tablet    Sig: Take 1 tablet (5 mg total) by mouth every evening.    Dispense:  90 tablet    Refill:  5    Diagnostics: Spirometry: FVC 3.18 L and FEV1 was 2.62 L with 110 mL postbronchodilator improvement.  This study was performed while the patient was asymptomatic.  Please  see scanned spirometry results for details. Environmental skin testing: Positive to molds. Food allergen skin testing: Negative despite a positive histamine control.    Physical examination: Blood pressure 108/80, pulse 71, temperature 97.9 F (36.6 C), temperature source Oral, resp. rate 16, height 5' 6.58" (1.691 m), weight 141 lb 15.6 oz (64.4 kg), SpO2 96 %.  General: Alert, interactive, in no acute distress. HEENT: TMs pearly gray, turbinates moderately edematous without discharge, post-pharynx moderately erythematous. Neck: Supple without lymphadenopathy. Lungs: Clear to auscultation without wheezing, rhonchi or rales. CV: Normal S1, S2 without murmurs. Abdomen: Nondistended, nontender. Skin: Scattered erythematous urticarial type lesions primarily located upper extremities , nonvesicular. Extremities:  No clubbing, cyanosis or edema. Neuro:   Grossly intact.  Review of systems:  Review of systems negative except as noted in HPI / PMHx or noted below: Review of Systems  Constitutional: Negative.   HENT: Negative.   Eyes: Negative.   Respiratory: Negative.   Cardiovascular: Negative.   Gastrointestinal: Negative.   Genitourinary: Negative.   Musculoskeletal: Negative.   Skin: Negative.   Neurological: Negative.   Endo/Heme/Allergies: Negative.   Psychiatric/Behavioral: Negative.     Past medical history:  Past Medical History:  Diagnosis Date  .  Environmental allergies   . Guillain Barr syndrome ([place])   . Hearing loss    Hearing Aids, bilateral  . High cholesterol   . History of chicken pox     Past surgical history:  Past Surgical History:  Procedure Laterality Date  . ABDOMINAL HYSTERECTOMY    . EYE SURGERY    . FOOT SURGERY    . KNEE ARTHROSCOPY    . MEDIAL PARTIAL KNEE REPLACEMENT  07-1998,08-1998    Family history: Family History  Problem Relation Age of Onset  . COPD Mother   . Heart disease Mother   . Hypertension Father   . Crohn's disease  Sister   . Hypertension Brother   . Heart disease Brother   . Kidney disease Son   . Tuberculosis Maternal Grandmother   . Leukemia Maternal Grandfather     Social history: Social History   Socioeconomic History  . Marital status: Married    Spouse name: Not on file  . Number of children: Not on file  . Years of education: Not on file  . Highest education level: Not on file  Social Needs  . Financial resource strain: Not on file  . Food insecurity - worry: Not on file  . Food insecurity - inability: Not on file  . Transportation needs - medical: Not on file  . Transportation needs - non-medical: Not on file  Occupational History  . Not on file  Tobacco Use  . Smoking status: Former Research scientist (life sciences)  . Smokeless tobacco: Never Used  Substance and Sexual Activity  . Alcohol use: No    Alcohol/week: 0.0 oz  . Drug use: No  . Sexual activity: Not on file  Other Topics Concern  . Not on file  Social History Narrative  . Not on file   Environmental History: The patient lives in a 67 year old house with carpeting throughout, gas heat, and central air.  She is a non-smoker without pets.  There is no known mold/water damage in the home.  Allergies as of 12/11/2016      Reactions   Penicillins Anaphylaxis, Rash   Darvon [propoxyphene] Swelling   Vicodin [hydrocodone-acetaminophen] Other (See Comments)   Unknown reaction   Methocarbamol Rash      Medication List        Accurate as of 12/11/16  1:57 PM. Always use your most recent med list.          albuterol 108 (90 Base) MCG/ACT inhaler Commonly known as:  PROVENTIL HFA;VENTOLIN HFA Inhale 2 puffs into the lungs every 6 (six) hours as needed for wheezing or shortness of breath.   alendronate 70 MG tablet Commonly known as:  FOSAMAX Take 1 tablet (70 mg total) by mouth once a week. Take with a full glass of water on an empty stomach.   atorvastatin 40 MG tablet Commonly known as:  LIPITOR Take 1 tablet (40 mg total) by  mouth daily.   azelastine 0.1 % nasal spray Commonly known as:  ASTELIN Two sprays each nostril twice a day as needed.   budesonide-formoterol 80-4.5 MCG/ACT inhaler Commonly known as:  SYMBICORT Inhale 2 puffs into the lungs 2 (two) times daily.   cholecalciferol 1000 units tablet Commonly known as:  VITAMIN D Take 1,000 Units daily by mouth.   EPINEPHrine 0.3 mg/0.3 mL Soaj injection Commonly known as:  EPI-PEN INJECT INTRAMUSCULARLY AS DIRECTED   famciclovir 500 MG tablet Commonly known as:  FAMVIR Take 1 tablet (500 mg total) by mouth 3 (three) times  daily.   Fish Oil 1000 MG Caps Take 1 capsule by mouth daily.   fluticasone 110 MCG/ACT inhaler Commonly known as:  FLOVENT HFA Two puffs with spacer twice a day during respiratory flares   fluticasone 50 MCG/ACT nasal spray Commonly known as:  FLONASE Place 2 sprays into both nostrils daily.   hydrOXYzine 10 MG tablet Commonly known as:  ATARAX/VISTARIL Take 1 tablet (10 mg total) 3 (three) times daily as needed by mouth for itching.   levocetirizine 5 MG tablet Commonly known as:  XYZAL Take 1 tablet (5 mg total) by mouth every evening.   levocetirizine 5 MG tablet Commonly known as:  XYZAL Take 1 tablet (5 mg total) by mouth every evening.   montelukast 10 MG tablet Commonly known as:  SINGULAIR Take 1 tablet (10 mg total) by mouth at bedtime.   MULTI-VITAMINS Tabs Take 1 tablet by mouth daily.   predniSONE 10 MG tablet Commonly known as:  DELTASONE 6 TAB PO DAY 1 5 TAB PO DAY 2 4 TAB PO DAY 3 3 TAB PO DAY 4 2 TAB PO DAY 5 1 TAB PO DAY 6   ranitidine 150 MG tablet Commonly known as:  ZANTAC One tablet one to two times a day as needed   tizanidine 2 MG capsule Commonly known as:  ZANAFLEX Take 1 capsule (2 mg total) by mouth 3 (three) times daily as needed for muscle spasms.   vitamin C 1000 MG tablet Take 1,000 mg by mouth daily.       Known medication allergies: Allergies  Allergen  Reactions  . Penicillins Anaphylaxis and Rash  . Darvon [Propoxyphene] Swelling  . Vicodin [Hydrocodone-Acetaminophen] Other (See Comments)    Unknown reaction  . Methocarbamol Rash    I appreciate the opportunity to take part in Yazlynn's care. Please do not hesitate to contact me with questions.  Sincerely,   R. Edgar Frisk, MD

## 2016-12-11 NOTE — Assessment & Plan Note (Signed)
Unclear etiology. Skin tests to select food allergens were negative today. NSAIDs and emotional stress commonly exacerbate urticaria but are not the underlying etiology in this case. Physical urticarias are negative by history (i.e. pressure-induced, temperature, vibration, solar, etc.). We will rule out other potential etiologies with labs. For symptom relief, patient is to take oral antihistamines as directed.  The following labs have been ordered: FCeRI antibody, TSH, anti-thyroglobulin antibody, thyroid peroxidase antibody, tryptase, urea breath test, CBC, CMP, ESR, ANA, and galactose-alpha-1,3-galactose IgE level.  The patient will be called with further recommendations after lab results have returned.  Instructions have been discussed and provided for H1/H2 receptor blockade with titration to find lowest effective dose.  Should there be a significant increase or change in symptoms, a journal is to be kept recording any foods eaten, beverages consumed, medications taken within a 6 hour period prior to the onset of symptoms, as well as record activities being performed, and environmental conditions. For any symptoms concerning for anaphylaxis, 911 is to be called immediately.

## 2016-12-11 NOTE — Assessment & Plan Note (Signed)
Associated angioedema occurs in up to 50% of patients with chronic urticaria.  Treatment/diagnostic plan as outlined above. 

## 2016-12-11 NOTE — Assessment & Plan Note (Signed)
   During respiratory tract infections or asthma flares, add Flovent 110g 2 inhalations 2 times per day until symptoms have returned to baseline.  To maximize pulmonary deposition, a spacer has been provided along with instructions for its proper administration with an HFA inhaler.  Continue albuterol HFA, 1-2 inhalations every 4-6 hours if needed.

## 2016-12-11 NOTE — Patient Instructions (Addendum)
Recurrent urticaria Unclear etiology. Skin tests to select food allergens were negative today. NSAIDs and emotional stress commonly exacerbate urticaria but are not the underlying etiology in this case. Physical urticarias are negative by history (i.e. pressure-induced, temperature, vibration, solar, etc.). We will rule out other potential etiologies with labs. For symptom relief, patient is to take oral antihistamines as directed.  The following labs have been ordered: FCeRI antibody, TSH, anti-thyroglobulin antibody, thyroid peroxidase antibody, tryptase, urea breath test, CBC, CMP, ESR, ANA, and galactose-alpha-1,3-galactose IgE level.  The patient will be called with further recommendations after lab results have returned.  Instructions have been discussed and provided for H1/H2 receptor blockade with titration to find lowest effective dose.  Should there be a significant increase or change in symptoms, a journal is to be kept recording any foods eaten, beverages consumed, medications taken within a 6 hour period prior to the onset of symptoms, as well as record activities being performed, and environmental conditions. For any symptoms concerning for anaphylaxis, 911 is to be called immediately.  Angioedema Associated angioedema occurs in up to 50% of patients with chronic urticaria.  Treatment/diagnostic plan as outlined above.  Cough variant asthma  During respiratory tract infections or asthma flares, add Flovent 110g 2 inhalations 2 times per day until symptoms have returned to baseline.  To maximize pulmonary deposition, a spacer has been provided along with instructions for its proper administration with an HFA inhaler.  Continue albuterol HFA, 1-2 inhalations every 4-6 hours if needed.  Perennial allergic rhinitis  Aeroallergen avoidance measures have been discussed and provided in written form.  Fexofenadine 180 mg daily if needed.  Montelukast has been prescribed (as  above).  Continue azelastine and fluticasone nasal spray as needed.  Nasal saline spray (i.e., Simply Saline) or nasal saline lavage (i.e., NeilMed) is recommended as needed and prior to medicated nasal sprays.   When lab results have returned the patient will be called with further recommendations and follow up instructions.   Urticaria (Hives)  . Fexofenadine (Allegra) 180 mg once a day.  If symptoms continue then increase to .  . Fexofenadine (Allegra) 180 mg  twice a day.  If symptoms continue then increase to .  . Fexofenadine (Allegra) 180 mg  twice a day and Ranitidine (Zantac) 150 mg once a day.  If symptoms continue then increase to.  . Fexofenadine (Allegra) 180 mg  twice a day and Ranitidine (Zantac) 150 mg twice a day  May use Benadryl as needed for breakthrough symptoms       If no symptoms for 7 days, then step down dosage  Control of Mold Allergen  Mold and fungi can grow on a variety of surfaces provided certain temperature and moisture conditions exist.  Outdoor molds grow on plants, decaying vegetation and soil.  The major outdoor mold, Alternaria and Cladosporium, are found in very high numbers during hot and dry conditions.  Generally, a late Summer - Fall peak is seen for common outdoor fungal spores.  Rain will temporarily lower outdoor mold spore count, but counts rise rapidly when the rainy period ends.  The most important indoor molds are Aspergillus and Penicillium.  Dark, humid and poorly ventilated basements are ideal sites for mold growth.  The next most common sites of mold growth are the bathroom and the kitchen.  Outdoor Mold Control 1. Use air conditioning and keep windows closed 2. Avoid exposure to decaying vegetation. 3. Avoid leaf raking. 4. Avoid grain handling. 5. Consider wearing a face   mask if working in moldy areas.  Indoor Mold Control 1. Maintain humidity below 50%. 2. Clean washable surfaces with 5% bleach solution. 3. Remove sources  e.g. Contaminated carpets.   

## 2016-12-11 NOTE — Assessment & Plan Note (Signed)
   Aeroallergen avoidance measures have been discussed and provided in written form.  Fexofenadine 180 mg daily if needed.  Montelukast has been prescribed (as above).  Continue azelastine and fluticasone nasal spray as needed.  Nasal saline spray (i.e., Simply Saline) or nasal saline lavage (i.e., NeilMed) is recommended as needed and prior to medicated nasal sprays.

## 2016-12-12 LAB — H. PYLORI BREATH TEST: H pylori Breath Test: NEGATIVE

## 2016-12-18 ENCOUNTER — Telehealth: Payer: Self-pay

## 2016-12-18 IMAGING — DX DG CHEST 2V
2 series · 2 of 2 positions shown · non-contrast
Comparison: 10/16/2014.  07/07/2008.

CLINICAL DATA: Cough.

EXAM:
CHEST  2 VIEW

[chest pa]
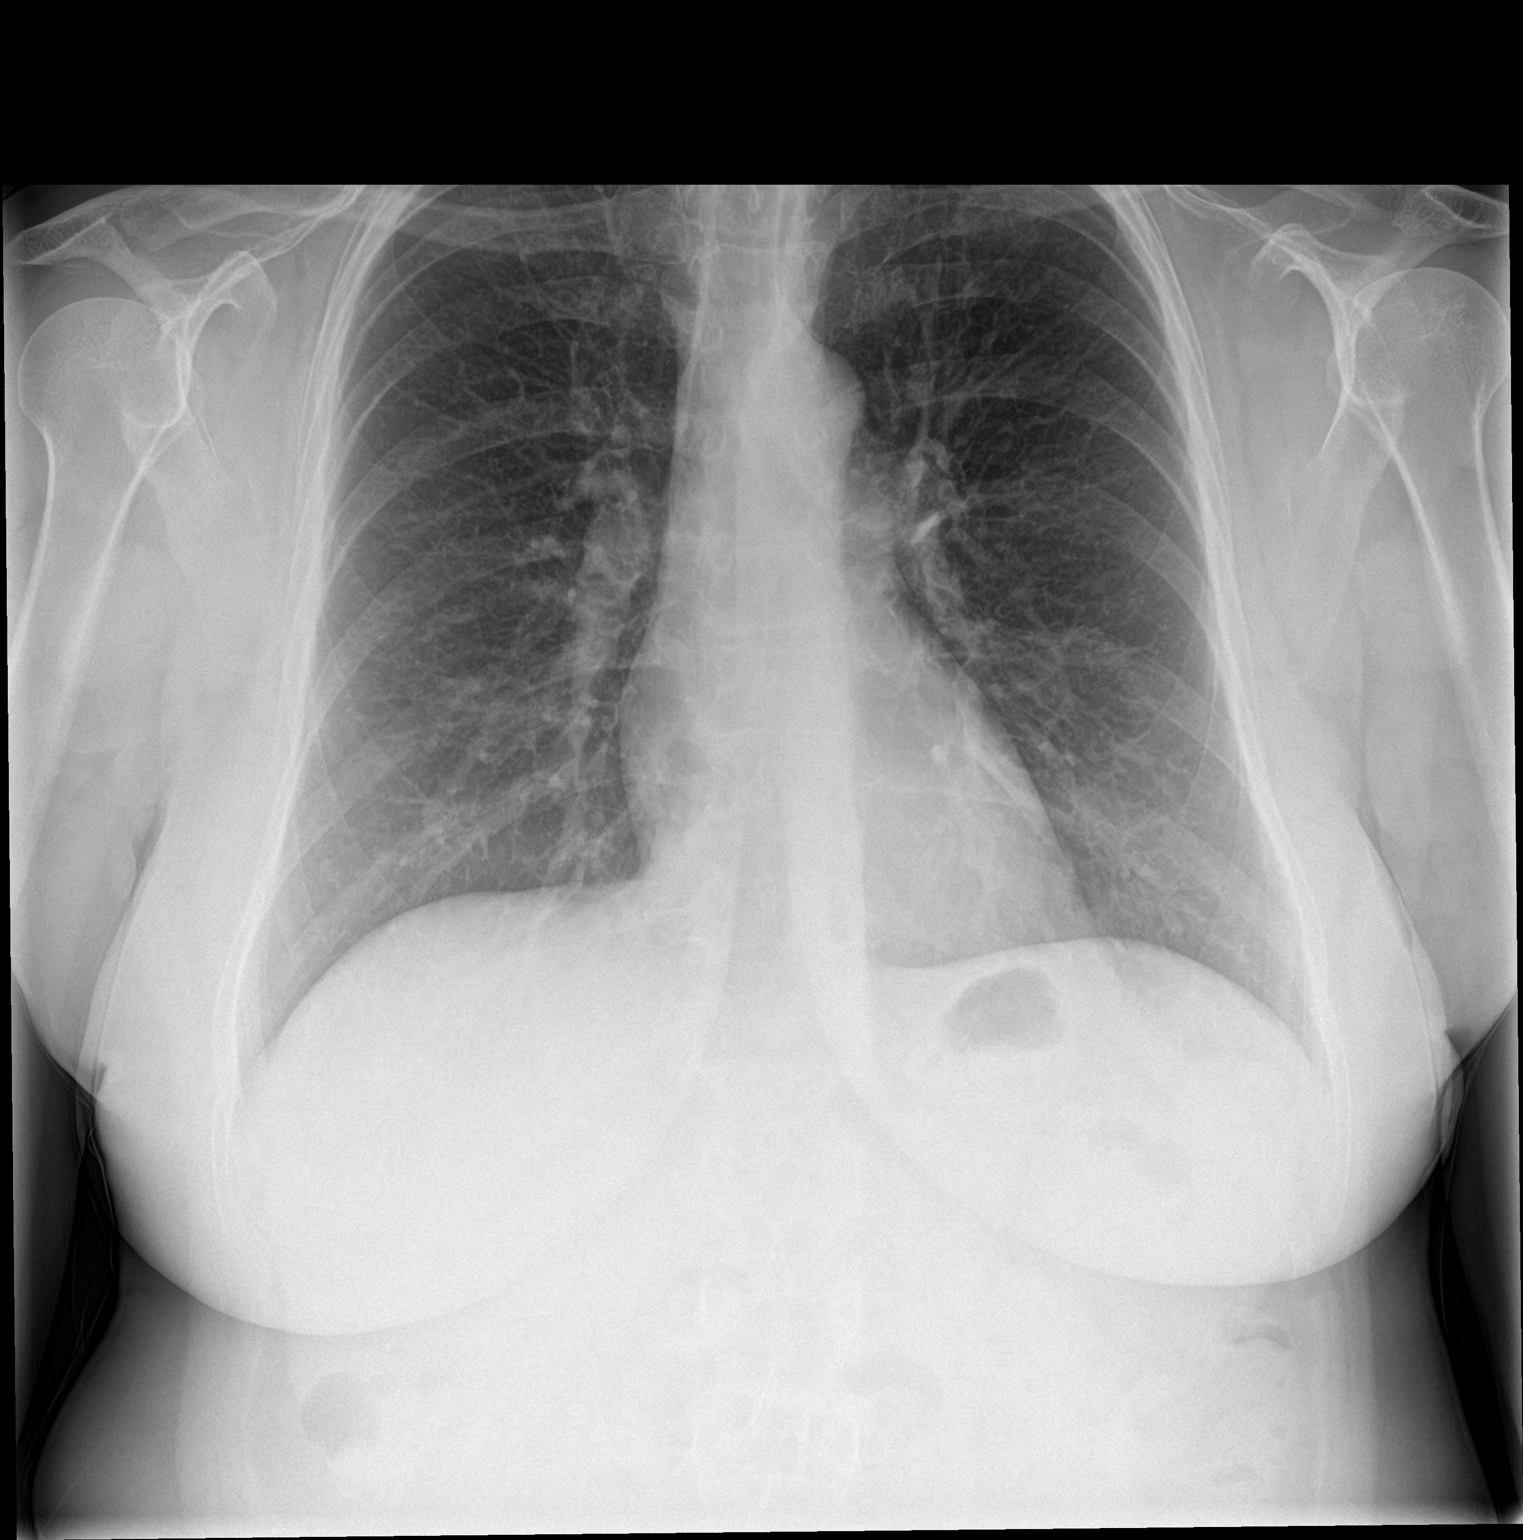

[chest lat]
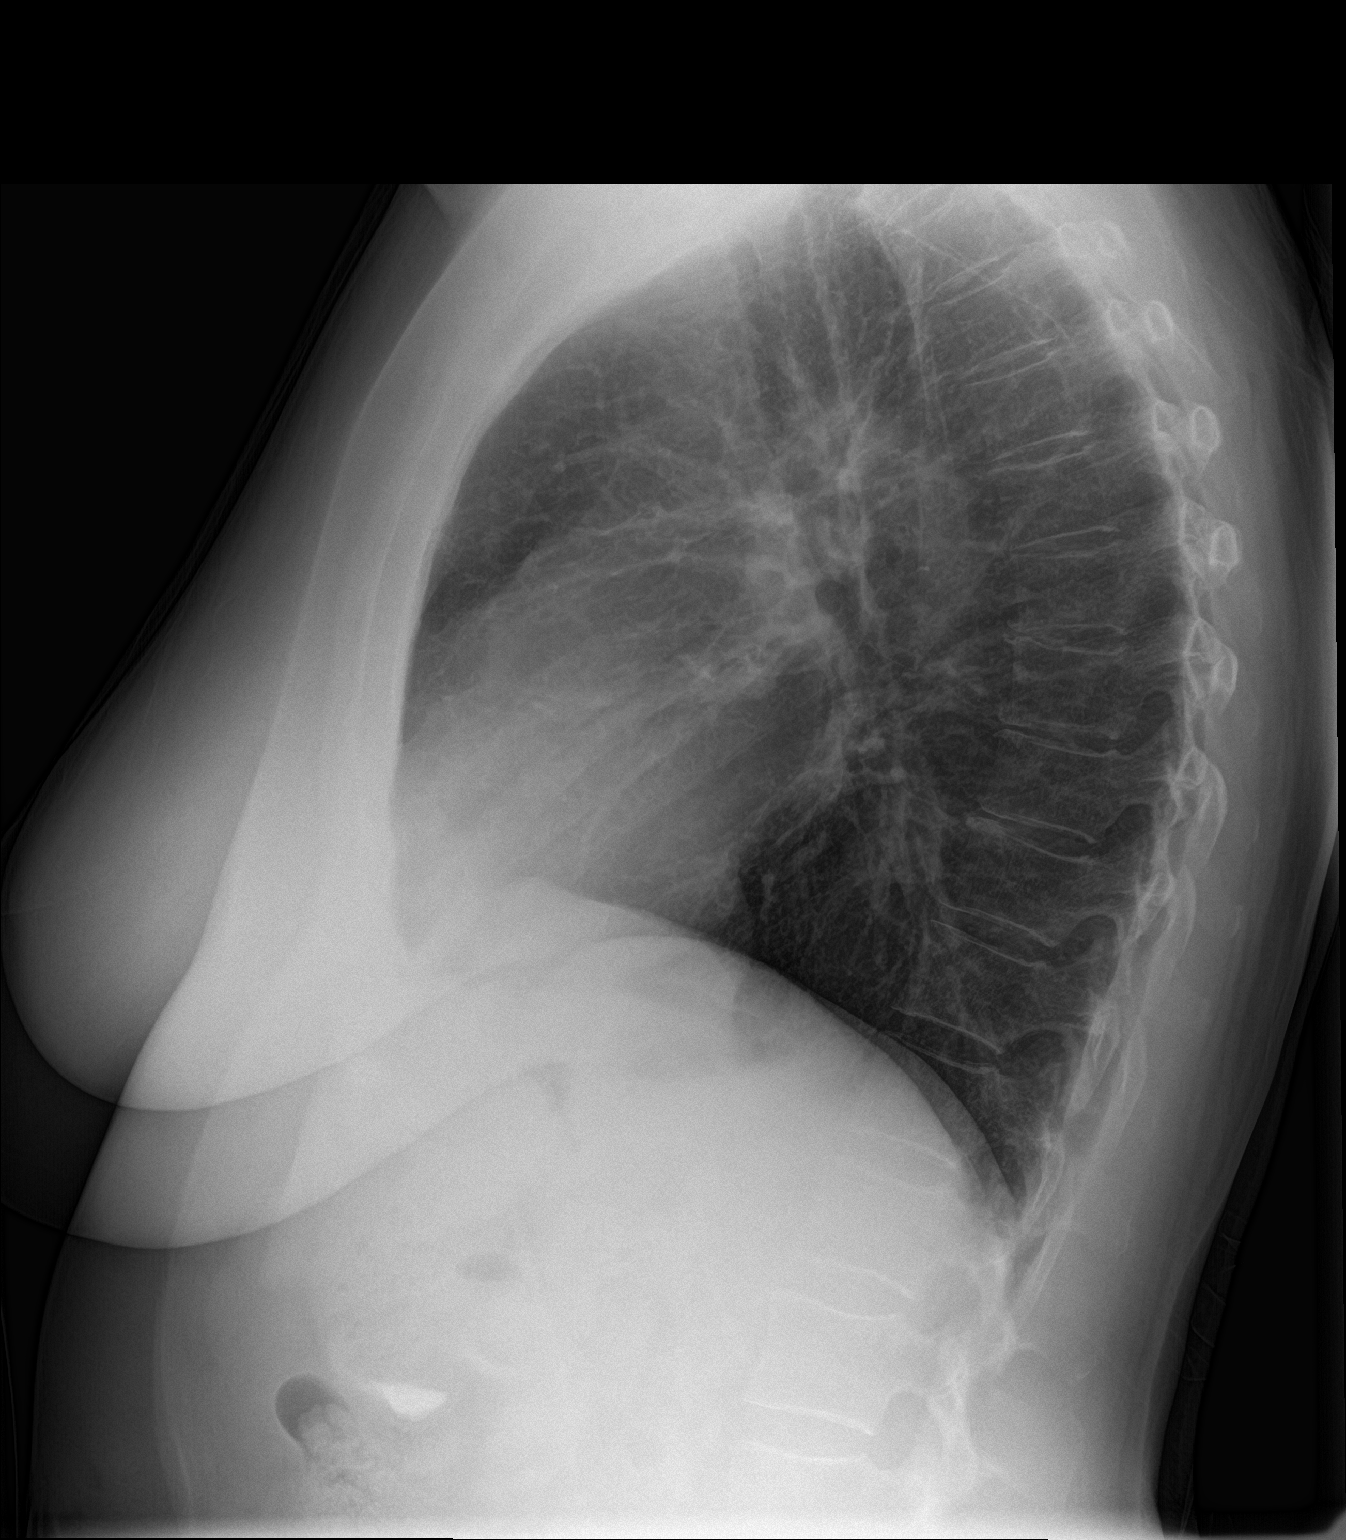

[2 of 2 positions shown; findings below may reference images not displayed]

FINDINGS: Mediastinum and hilar structures normal. Lungs are clear. Bilateral
apical pleural thickening noted consistent with scarring. Heart size
normal. No pleural effusion or pneumothorax.
IMPRESSION: No acute cardiopulmonary disease.

## 2016-12-18 MED FILL — ALENDRONATE NA 70 MG TAB: 70 | 84 days supply | Qty: 12 | Fill #2 | Status: TO

## 2016-12-18 NOTE — Telephone Encounter (Signed)
Opened in error

## 2016-12-25 LAB — CBC WITH DIFFERENTIAL/PLATELET
Basophils Absolute: 0 10*3/uL (ref 0.0–0.2)
Basos: 0 %
EOS (ABSOLUTE): 0.1 10*3/uL (ref 0.0–0.4)
Eos: 2 %
Hematocrit: 40.9 % (ref 34.0–46.6)
Hemoglobin: 13.2 g/dL (ref 11.1–15.9)
Immature Grans (Abs): 0 10*3/uL (ref 0.0–0.1)
Immature Granulocytes: 0 %
Lymphocytes Absolute: 1.7 10*3/uL (ref 0.7–3.1)
Lymphs: 38 %
MCH: 28.8 pg (ref 26.6–33.0)
MCHC: 32.3 g/dL (ref 31.5–35.7)
MCV: 89 fL (ref 79–97)
Monocytes Absolute: 0.3 10*3/uL (ref 0.1–0.9)
Monocytes: 5 %
Neutrophils Absolute: 2.5 10*3/uL (ref 1.4–7.0)
Neutrophils: 55 %
Platelets: 189 10*3/uL (ref 150–379)
RBC: 4.58 x10E6/uL (ref 3.77–5.28)
RDW: 13.7 % (ref 12.3–15.4)
WBC: 4.6 10*3/uL (ref 3.4–10.8)

## 2016-12-25 LAB — COMPREHENSIVE METABOLIC PANEL
ALT: 24 IU/L (ref 0–32)
AST: 21 IU/L (ref 0–40)
Albumin/Globulin Ratio: 1.9 (ref 1.2–2.2)
Albumin: 4.4 g/dL (ref 3.6–4.8)
Alkaline Phosphatase: 64 IU/L (ref 39–117)
BUN/Creatinine Ratio: 18 (ref 12–28)
BUN: 14 mg/dL (ref 8–27)
Bilirubin Total: 1.8 mg/dL — ABNORMAL HIGH (ref 0.0–1.2)
CO2: 26 mmol/L (ref 20–29)
Calcium: 9.8 mg/dL (ref 8.7–10.3)
Chloride: 102 mmol/L (ref 96–106)
Creatinine, Ser: 0.79 mg/dL (ref 0.57–1.00)
GFR calc Af Amer: 90 mL/min/{1.73_m2} (ref >59)
GFR calc non Af Amer: 78 mL/min/{1.73_m2} (ref >59)
Globulin, Total: 2.3 g/dL (ref 1.5–4.5)
Glucose: 126 mg/dL — ABNORMAL HIGH (ref 65–99)
Potassium: 3.9 mmol/L (ref 3.5–5.2)
Sodium: 144 mmol/L (ref 134–144)
Total Protein: 6.7 g/dL (ref 6.0–8.5)

## 2016-12-25 LAB — ALPHA-GAL PANEL
Alpha Gal IgE*: <0.1 kU/L (ref <0.10)
Beef (Bos spp) IgE: 0.14 kU/L (ref <0.35)
Class Interpretation: 0
Class Interpretation: 0
Lamb/Mutton (Ovis spp) IgE: <0.1 kU/L (ref <0.35)
Pork (Sus spp) IgE: <0.1 kU/L (ref <0.35)

## 2016-12-25 LAB — TSH: TSH: 0.842 u[IU]/mL (ref 0.450–4.500)

## 2016-12-25 LAB — THYROID PEROXIDASE ANTIBODY: Thyroperoxidase Ab SerPl-aCnc: 10 IU/mL (ref 0–34)

## 2016-12-25 LAB — ANA W/REFLEX IF POSITIVE: Anti Nuclear Antibody(ANA): NEGATIVE

## 2016-12-25 LAB — CHRONIC URTICARIA: cu index: <2.3 (ref <10)

## 2016-12-25 LAB — TRYPTASE: Tryptase: 6.2 ug/L (ref 2.2–13.2)

## 2016-12-25 LAB — THYROGLOBULIN LEVEL: Thyroglobulin (TG-RIA): 23 ng/mL

## 2016-12-25 LAB — SEDIMENTATION RATE: Sed Rate: 4 mm/hr (ref 0–40)

## 2016-12-26 ENCOUNTER — Encounter: Payer: Self-pay | Admitting: Family Medicine

## 2016-12-26 MED ORDER — CLINDAMYCIN HCL 300 MG PO CAPS: ORAL_CAPSULE | ORAL | 0 refills | Status: DC

## 2016-12-26 MED FILL — CLINDAMYCIN HCL 300 MG CAPS: 300 | 5 days supply | Qty: 10 | Fill #0

## 2017-01-10 ENCOUNTER — Encounter: Payer: Self-pay | Admitting: Family Medicine

## 2017-01-10 MED ORDER — MONTELUKAST SODIUM 10 MG PO TABS: 10.0000 mg | ORAL_TABLET | Freq: Every day | ORAL | 1 refills | Status: DC

## 2017-01-10 MED FILL — MONTELUKAST SOD 10 MG TAB: 10 | 90 days supply | Qty: 90 | Fill #0

## 2017-01-29 MED FILL — AZELASTINE 0.1% (137 MCG) S: 0.1 | 30 days supply | Qty: 30 | Fill #2 | Status: TO

## 2017-01-29 MED FILL — ATORVASTATIN 40 MG TABLET: 40 | 90 days supply | Qty: 90 | Fill #3

## 2017-02-04 DIAGNOSIS — H524 Presbyopia: Secondary | ICD-10-CM | POA: Diagnosis not present

## 2017-02-04 DIAGNOSIS — H35033 Hypertensive retinopathy, bilateral: Secondary | ICD-10-CM | POA: Diagnosis not present

## 2017-02-04 DIAGNOSIS — H2513 Age-related nuclear cataract, bilateral: Secondary | ICD-10-CM | POA: Diagnosis not present

## 2017-04-07 NOTE — Progress Notes (Addendum)
[name] at [facility] [address], [place], [place] [address] [phone]  Date:  04/10/2017   Name:  [name]   DOB:  [birth year]   MRN:  [id number]  PCP:  Mary Mclean, MD    Chief Complaint: Annual Exam (Pt here for CPE. )   History of Present Illness:  [name] is a 67 y.o. very pleasant female patient who presents with the following:  Here today for a CPE Last seen by myself in the fall:  Here today for a 6 month follow-up visit History of hyperlipidemia, elevated glucose with borderline A1c Needs a flu shot today- will give today She did have the zostavax last year - we discussed shingrix. She will likley have this at some point later on  Needs a refill of her astelin, and her zanaflex which she uses for occasional muscle spasm in her upper gack Her allergies have not been that bad this year She continues to use symbicort but not every day  At our last visit she has lost about 20 lbs through [facility] and has maintained this weight as below  Mammo: 2/17- done yesterday per her GYN Dr. Ulanda Edison at [facility] Dexa: 4/17- osteoporosis.  Started on fosamax.  Due for repeat scan Labs: lipids, CMP done 6 months ago  Colon: 2012, repeat in 10 years  ?prevnar- pt had 2 does of pneumovax per chart.  With pneumovax 2 years ago and had a mild allergic type reaction to the shot. Will defer giving prevnar today Discussed tetanus- not covered by medicare, no recent wound.  She will get a booster if any wound   She is now splitting time between here and [place]- they bought a home in [place] which is near [place] Her 67 yo mother is also looking for a small home in that that area Her breathing is good right now- she has not generally needed her inhalers She does suffer from chronic allergies, and had hives earlier this year.  Her hives are now resolved, we were not able to determine the cause of her hives  Wt  Readings from Last 3 Encounters:  04/10/17 142 lb (64.4 kg)  12/11/16 141 lb 15.6 oz (64.4 kg)  11/02/16 143 lb (64.9 kg)    From our last lab notes:  Your labs look great overall  A1c is in normal range  Cholesterol looks great!  Blood count is normal except your white cell count is slightly low. I suspect this is completely benign variation, but we will plan to recheck at our next visit  Your metabolic profile is normal except your bilirubin is high. However looking back this has been the case for the last couple of years, and is likely also quite benign. However, I would like to check a liver and gallbladder ultrasound to make sure all looks well.   I will set this up for you in the next week or so   [place] from September: IMPRESSION: No gallstones or sonographic evidence of acute cholecystitis. Mildly increased hepatic echotexture likely reflects fatty infiltrative change.   Patient Active Problem List   Diagnosis Date Noted  . Recurrent urticaria 12/11/2016  . Angioedema 12/11/2016  . Cough variant asthma 12/11/2016  . Perennial allergic rhinitis 12/11/2016  . Routine history and physical examination of adult 04/06/2015  . Need for shingles vaccine 04/06/2015  . Environmental allergies 03/23/2015  . Hyperlipidemia 03/23/2015  . Gastro-esophageal reflux  disease without esophagitis 02/04/2014  . 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 Osteopenia 07/20/2013    Past Medical History:  Diagnosis Date  . Environmental allergies   . Guillain Barr syndrome ([place])   . Hearing loss    Hearing Aids, bilateral  . High cholesterol   . History of chicken pox     Past Surgical History:  Procedure Laterality Date  . ABDOMINAL HYSTERECTOMY    . EYE SURGERY    . FOOT SURGERY    . KNEE ARTHROSCOPY    . MEDIAL PARTIAL KNEE REPLACEMENT  07-1998,08-1998    Social History   Tobacco Use  . Smoking status: Former Research scientist (life sciences)  . Smokeless tobacco: Never Used   Substance Use Topics  . Alcohol use: No    Alcohol/week: 0.0 oz  . Drug use: No    Family History  Problem Relation Age of Onset  . COPD Mother   . Heart disease Mother   . Hypertension Father   . Crohn's disease Sister   . Hypertension Brother   . Heart disease Brother   . Kidney disease Son   . Tuberculosis Maternal Grandmother   . Leukemia Maternal Grandfather     Allergies  Allergen Reactions  . Penicillins Anaphylaxis and Rash  . Darvon [Propoxyphene] Swelling  . Vicodin [Hydrocodone-Acetaminophen] Other (See Comments)    Unknown reaction  . Methocarbamol Rash    Medication list has been reviewed and updated.  Current Outpatient Medications on File Prior to Visit  Medication Sig Dispense Refill  . albuterol (PROVENTIL HFA;VENTOLIN HFA) 108 (90 Base) MCG/ACT inhaler Inhale 2 puffs into the lungs every 6 (six) hours as needed for wheezing or shortness of breath. 1 Inhaler 0  . alendronate (FOSAMAX) 70 MG tablet Take 1 tablet (70 mg total) by mouth once a week. Take with a full glass of water on an empty stomach. 12 tablet 3  . Ascorbic Acid (VITAMIN C) 1000 MG tablet Take 1,000 mg by mouth daily.     Marland Kitchen atorvastatin (LIPITOR) 40 MG tablet Take 1 tablet (40 mg total) by mouth daily. 90 tablet 3  . azelastine (ASTELIN) 0.1 % nasal spray Two sprays each nostril twice a day as needed. 30 mL 5  . budesonide-formoterol (SYMBICORT) 80-4.5 MCG/ACT inhaler Inhale 2 puffs into the lungs 2 (two) times daily. (Patient taking differently: Inhale 2 puffs daily as needed into the lungs. ) 3 Inhaler 6  . cholecalciferol (VITAMIN D) 1000 units tablet Take 1,000 Units daily by mouth.    . clindamycin (CLEOCIN) 300 MG capsule Take 2 (600mg ) by mouth once 30-60 min prior to dental procedure 10 capsule 0  . EPINEPHrine 0.3 mg/0.3 mL IJ SOAJ injection INJECT INTRAMUSCULARLY AS DIRECTED  0  . fexofenadine (ALLEGRA) 60 MG tablet Take 60 mg by mouth daily.    . fluticasone (FLONASE) 50 MCG/ACT  nasal spray Place 2 sprays into both nostrils daily. 16 g 2  . fluticasone (FLOVENT HFA) 110 MCG/ACT inhaler Two puffs with spacer twice a day during respiratory flares 1 Inhaler 5  . hydrOXYzine (ATARAX/VISTARIL) 10 MG tablet Take 1 tablet (10 mg total) 3 (three) times daily as needed by mouth for itching. 30 tablet 1  . montelukast (SINGULAIR) 10 MG tablet Take 1 tablet (10 mg total) by mouth at bedtime. 90 tablet 1  . Multiple Vitamin (MULTI-VITAMINS) TABS Take 1 tablet by mouth daily.    . Omega-3 Fatty Acids (FISH OIL) 1000 MG CAPS Take 1 capsule by mouth daily.    Marland Kitchen  ranitidine (ZANTAC) 150 MG tablet One tablet one to two times a day as needed 60 tablet 5  . tizanidine (ZANAFLEX) 2 MG capsule Take 1 capsule (2 mg total) by mouth 3 (three) times daily as needed for muscle spasms. 30 capsule 1   No current facility-administered medications on file prior to visit.     Review of Systems:  As per HPI- otherwise negative. She walks daily for exercise, no CP or SOB with exercise    Physical Examination: Vitals:   04/10/17 0841  BP: 124/82  Pulse: 72  Temp: 97.8 F (36.6 C)  SpO2: 94%   Vitals:   04/10/17 0841  Weight: 142 lb (64.4 kg)  Height: 5\' 6"  (1.676 m)   Body mass index is 22.92 kg/m. Ideal Body Weight: Weight in (lb) to have BMI = 25: 154.6  GEN: WDWN, NAD, Non-toxic, A & O x 3, looks well, normal weight  HEENT: Atraumatic, Normocephalic. Neck supple. No masses, No LAD. Bilateral TM wnl, oropharynx normal.  PEERL,EOMI.   Ears and Nose: No external deformity. CV: RRR, No M/G/R. No JVD. No thrill. No extra heart sounds. PULM: CTA B, no wheezes, crackles, rhonchi. No retractions. No resp. distress. No accessory muscle use. ABD: S, NT, ND, +BS. No rebound. No HSM. EXTR: No c/c/e NEURO Normal gait.  PSYCH: Normally interactive. Conversant. Not depressed or anxious appearing.  Calm demeanor.    Assessment and Plan: Physical exam  Mixed hyperlipidemia  Age-related  osteoporosis without current pathological fracture - Plan: DG Bone Density  Elevated bilirubin - Plan: Hepatic function panel  CPE today- she is doing well, feeling well, maintaining her weight loss Started on fosamax in 2017- check dexa  Follow-up on her bilirubin Will plan further follow- up pending labs. Lipids are well controlled as of September- continue lipitor   Signed Lamar Blinks, MD  Received her labs 3/21- indirect bili is high c/w gilbert syndrome  Message to pt: Your liver function tests look good- primary "indirect bilirubin" which is consistent with Gilbert's.   Minimal AST elevation is not of concern, will repeat next time we draw blood  Take care! Let's visit in about 6 months  Results for orders placed or performed in visit on 04/10/17  Hepatic function panel  Result Value Ref Range   Total Bilirubin 2.0 (H) 0.2 - 1.2 mg/dL   Bilirubin, Direct 0.4 (H) 0.0 - 0.3 mg/dL   Alkaline Phosphatase 57 39 - 117 U/L   AST 27 0 - 37 U/L   ALT 38 (H) 0 - 35 U/L   Total Protein 7.1 6.0 - 8.3 g/dL   Albumin 4.5 3.5 - 5.2 g/dL

## 2017-04-08 DIAGNOSIS — Z1231 Encounter for screening mammogram for malignant neoplasm of breast: Secondary | ICD-10-CM | POA: Diagnosis not present

## 2017-04-08 DIAGNOSIS — Z6822 Body mass index (BMI) 22.0-22.9, adult: Secondary | ICD-10-CM | POA: Diagnosis not present

## 2017-04-08 DIAGNOSIS — Z1389 Encounter for screening for other disorder: Secondary | ICD-10-CM | POA: Diagnosis not present

## 2017-04-08 DIAGNOSIS — Z01419 Encounter for gynecological examination (general) (routine) without abnormal findings: Secondary | ICD-10-CM | POA: Diagnosis not present

## 2017-04-08 DIAGNOSIS — Z130 Encounter for screening for diseases of the blood and blood-forming organs and certain disorders involving the immune mechanism: Secondary | ICD-10-CM | POA: Diagnosis not present

## 2017-04-10 ENCOUNTER — Encounter: Payer: Self-pay | Admitting: Family Medicine

## 2017-04-10 ENCOUNTER — Ambulatory Visit (INDEPENDENT_AMBULATORY_CARE_PROVIDER_SITE_OTHER): Payer: PPO | Admitting: Family Medicine

## 2017-04-10 VITALS — BP 124/82 | HR 72 | Temp 97.8°F | Ht 66.0 in | Wt 142.0 lb

## 2017-04-10 DIAGNOSIS — M810 Age-related osteoporosis without current pathological fracture: Secondary | ICD-10-CM | POA: Diagnosis not present

## 2017-04-10 DIAGNOSIS — Z0000 Encounter for general adult medical examination without abnormal findings: Secondary | ICD-10-CM

## 2017-04-10 DIAGNOSIS — R17 Unspecified jaundice: Secondary | ICD-10-CM | POA: Diagnosis not present

## 2017-04-10 DIAGNOSIS — E782 Mixed hyperlipidemia: Secondary | ICD-10-CM

## 2017-04-10 LAB — HEPATIC FUNCTION PANEL
ALBUMIN: 4.5 g/dL (ref 3.5–5.2)
ALT: 38 U/L — ABNORMAL HIGH (ref 0–35)
AST: 27 U/L (ref 0–37)
Alkaline Phosphatase: 57 U/L (ref 39–117)
BILIRUBIN TOTAL: 2 mg/dL — AB (ref 0.2–1.2)
Bilirubin, Direct: 0.4 mg/dL — ABNORMAL HIGH (ref 0.0–0.3)
Total Protein: 7.1 g/dL (ref 6.0–8.3)

## 2017-04-10 NOTE — Patient Instructions (Addendum)
Great to see you today!  It looks like you are doing great I am going to set you up for a bone density scan so we can see how the fosamax is working for you  I will be in touch with your labs- let's just check your liver function today to follow-up on your elevated bilirubin  h Health Maintenance for Postmenopausal Women Menopause is a normal process in which your reproductive ability comes to an end. This process happens gradually over a span of months to years, usually between the ages of 80 and 63. Menopause is complete when you have missed 12 consecutive menstrual periods. It is important to talk with your health care provider about some of the most common conditions that affect postmenopausal women, such as heart disease, cancer, and bone loss (osteoporosis). Adopting a healthy lifestyle and getting preventive care can help to promote your health and wellness. Those actions can also lower your chances of developing some of these common conditions. What should I know about menopause? During menopause, you may experience a number of symptoms, such as:  Moderate-to-severe hot flashes.  Night sweats.  Decrease in sex drive.  Mood swings.  Headaches.  Tiredness.  Irritability.  Memory problems.  Insomnia.  Choosing to treat or not to treat menopausal changes is an individual decision that you make with your health care provider. What should I know about hormone replacement therapy and supplements? Hormone therapy products are effective for treating symptoms that are associated with menopause, such as hot flashes and night sweats. Hormone replacement carries certain risks, especially as you become older. If you are thinking about using estrogen or estrogen with progestin treatments, discuss the benefits and risks with your health care provider. What should I know about heart disease and stroke? Heart disease, heart attack, and stroke become more likely as you age. This may be due, in  part, to the hormonal changes that your body experiences during menopause. These can affect how your body processes dietary fats, triglycerides, and cholesterol. Heart attack and stroke are both medical emergencies. There are many things that you can do to help prevent heart disease and stroke:  Have your blood pressure checked at least every 1-2 years. High blood pressure causes heart disease and increases the risk of stroke.  If you are 84-1 years old, ask your health care provider if you should take aspirin to prevent a heart attack or a stroke.  Do not use any tobacco products, including cigarettes, chewing tobacco, or electronic cigarettes. If you need help quitting, ask your health care provider.  It is important to eat a healthy diet and maintain a healthy weight. ? Be sure to include plenty of vegetables, fruits, low-fat dairy products, and lean protein. ? Avoid eating foods that are high in solid fats, added sugars, or salt (sodium).  Get regular exercise. This is one of the most important things that you can do for your health. ? Try to exercise for at least 150 minutes each week. The type of exercise that you do should increase your heart rate and make you sweat. This is known as moderate-intensity exercise. ? Try to do strengthening exercises at least twice each week. Do these in addition to the moderate-intensity exercise.  Know your numbers.Ask your health care provider to check your cholesterol and your blood glucose. Continue to have your blood tested as directed by your health care provider.  What should I know about cancer screening? There are several types of cancer. Take  the following steps to reduce your risk and to catch any cancer development as early as possible. Breast Cancer  Practice breast self-awareness. ? This means understanding how your breasts normally appear and feel. ? It also means doing regular breast self-exams. Let your health care provider know about  any changes, no matter how small.  If you are 7 or older, have a clinician do a breast exam (clinical breast exam or CBE) every year. Depending on your age, family history, and medical history, it may be recommended that you also have a yearly breast X-ray (mammogram).  If you have a family history of breast cancer, talk with your health care provider about genetic screening.  If you are at high risk for breast cancer, talk with your health care provider about having an MRI and a mammogram every year.  Breast cancer (BRCA) gene test is recommended for women who have family members with BRCA-related cancers. Results of the assessment will determine the need for genetic counseling and BRCA1 and for BRCA2 testing. BRCA-related cancers include these types: ? Breast. This occurs in males or females. ? Ovarian. ? Tubal. This may also be called fallopian tube cancer. ? Cancer of the abdominal or pelvic lining (peritoneal cancer). ? Prostate. ? Pancreatic.  Cervical, Uterine, and Ovarian Cancer Your health care provider may recommend that you be screened regularly for cancer of the pelvic organs. These include your ovaries, uterus, and vagina. This screening involves a pelvic exam, which includes checking for microscopic changes to the surface of your cervix (Pap test).  For women ages 21-65, health care providers may recommend a pelvic exam and a Pap test every three years. For women ages 51-65, they may recommend the Pap test and pelvic exam, combined with testing for human papilloma virus (HPV), every five years. Some types of HPV increase your risk of cervical cancer. Testing for HPV may also be done on women of any age who have unclear Pap test results.  Other health care providers may not recommend any screening for nonpregnant women who are considered low risk for pelvic cancer and have no symptoms. Ask your health care provider if a screening pelvic exam is right for you.  If you have had  past treatment for cervical cancer or a condition that could lead to cancer, you need Pap tests and screening for cancer for at least 20 years after your treatment. If Pap tests have been discontinued for you, your risk factors (such as having a new sexual partner) need to be reassessed to determine if you should start having screenings again. Some women have medical problems that increase the chance of getting cervical cancer. In these cases, your health care provider may recommend that you have screening and Pap tests more often.  If you have a family history of uterine cancer or ovarian cancer, talk with your health care provider about genetic screening.  If you have vaginal bleeding after reaching menopause, tell your health care provider.  There are currently no reliable tests available to screen for ovarian cancer.  Lung Cancer Lung cancer screening is recommended for adults 64-13 years old who are at high risk for lung cancer because of a history of smoking. A yearly low-dose CT scan of the lungs is recommended if you:  Currently smoke.  Have a history of at least 30 pack-years of smoking and you currently smoke or have quit within the past 15 years. A pack-year is smoking an average of one pack of cigarettes per  day for one year.  Yearly screening should:  Continue until it has been 15 years since you quit.  Stop if you develop a health problem that would prevent you from having lung cancer treatment.  Colorectal Cancer  This type of cancer can be detected and can often be prevented.  Routine colorectal cancer screening usually begins at age 108 and continues through age 69.  If you have risk factors for colon cancer, your health care provider may recommend that you be screened at an earlier age.  If you have a family history of colorectal cancer, talk with your health care provider about genetic screening.  Your health care provider may also recommend using home test kits to  check for hidden blood in your stool.  A small camera at the end of a tube can be used to examine your colon directly (sigmoidoscopy or colonoscopy). This is done to check for the earliest forms of colorectal cancer.  Direct examination of the colon should be repeated every 5-10 years until age 36. However, if early forms of precancerous polyps or small growths are found or if you have a family history or genetic risk for colorectal cancer, you may need to be screened more often.  Skin Cancer  Check your skin from head to toe regularly.  Monitor any moles. Be sure to tell your health care provider: ? About any new moles or changes in moles, especially if there is a change in a mole's shape or color. ? If you have a mole that is larger than the size of a pencil eraser.  If any of your family members has a history of skin cancer, especially at a young age, talk with your health care provider about genetic screening.  Always use sunscreen. Apply sunscreen liberally and repeatedly throughout the day.  Whenever you are outside, protect yourself by wearing long sleeves, pants, a wide-brimmed hat, and sunglasses.  What should I know about osteoporosis? Osteoporosis is a condition in which bone destruction happens more quickly than new bone creation. After menopause, you may be at an increased risk for osteoporosis. To help prevent osteoporosis or the bone fractures that can happen because of osteoporosis, the following is recommended:  If you are 4-62 years old, get at least 1,000 mg of calcium and at least 600 mg of vitamin D per day.  If you are older than age 58 but younger than age 98, get at least 1,200 mg of calcium and at least 600 mg of vitamin D per day.  If you are older than age 47, get at least 1,200 mg of calcium and at least 800 mg of vitamin D per day.  Smoking and excessive alcohol intake increase the risk of osteoporosis. Eat foods that are rich in calcium and vitamin D, and  do weight-bearing exercises several times each week as directed by your health care provider. What should I know about how menopause affects my mental health? Depression may occur at any age, but it is more common as you become older. Common symptoms of depression include:  Low or sad mood.  Changes in sleep patterns.  Changes in appetite or eating patterns.  Feeling an overall lack of motivation or enjoyment of activities that you previously enjoyed.  Frequent crying spells.  Talk with your health care provider if you think that you are experiencing depression. What should I know about immunizations? It is important that you get and maintain your immunizations. These include:  Tetanus, diphtheria, and pertussis (  Tdap) booster vaccine.  Influenza every year before the flu season begins.  Pneumonia vaccine.  Shingles vaccine.  Your health care provider may also recommend other immunizations. This information is not intended to replace advice given to you by your health care provider. Make sure you discuss any questions you have with your health care provider. Document Released: 03/02/2005 Document Revised: 07/29/2015 Document Reviewed: 10/12/2014 Elsevier Interactive Patient Education  2018 [employer].

## 2017-04-11 ENCOUNTER — Encounter: Payer: Self-pay | Admitting: Family Medicine

## 2017-04-12 ENCOUNTER — Other Ambulatory Visit: Payer: Self-pay | Admitting: Obstetrics and Gynecology

## 2017-04-12 DIAGNOSIS — R928 Other abnormal and inconclusive findings on diagnostic imaging of breast: Secondary | ICD-10-CM

## 2017-04-16 ENCOUNTER — Ambulatory Visit
Admission: RE | Admit: 2017-04-16 | Discharge: 2017-04-16 | Disposition: A | Discharge status: Home / Self Care | Payer: PPO | Source: Ambulatory Visit | Attending: Obstetrics and Gynecology | Admitting: Obstetrics and Gynecology

## 2017-04-16 ENCOUNTER — Ambulatory Visit: Admission: RE | Admit: 2017-04-16 | Payer: PPO | Source: Ambulatory Visit

## 2017-04-16 DIAGNOSIS — R928 Other abnormal and inconclusive findings on diagnostic imaging of breast: Secondary | ICD-10-CM

## 2017-04-16 DIAGNOSIS — R922 Inconclusive mammogram: Secondary | ICD-10-CM | POA: Diagnosis not present

## 2017-04-19 ENCOUNTER — Other Ambulatory Visit: Payer: Self-pay | Admitting: Family Medicine

## 2017-04-19 DIAGNOSIS — E785 Hyperlipidemia, unspecified: Secondary | ICD-10-CM

## 2017-04-19 MED FILL — ATORVASTATIN 40 MG TABLET: 40 | 90 days supply | Qty: 90 | Fill #0

## 2017-04-22 ENCOUNTER — Encounter: Payer: Self-pay | Admitting: Family Medicine

## 2017-04-22 ENCOUNTER — Ambulatory Visit (INDEPENDENT_AMBULATORY_CARE_PROVIDER_SITE_OTHER): Payer: PPO | Admitting: Family Medicine

## 2017-04-22 VITALS — BP 104/76 | HR 67 | Temp 97.8°F | Ht 66.0 in | Wt 142.6 lb

## 2017-04-22 DIAGNOSIS — J3089 Other allergic rhinitis: Secondary | ICD-10-CM | POA: Diagnosis not present

## 2017-04-22 MED ORDER — PREDNISONE 20 MG PO TABS: ORAL_TABLET | ORAL | 0 refills | Status: DC

## 2017-04-22 MED FILL — predniSONE 20 MG TABS: 20 | 7 days supply | Qty: 7 | Fill #0

## 2017-04-22 NOTE — Patient Instructions (Signed)
Good to see you today- I send in prednisone for you to use for your symptoms, take 1 daily for 5-7 days.  Please let me know if not feeling better in the next few days- Sooner if worse.

## 2017-04-22 NOTE — Progress Notes (Signed)
[facility] [place], [place], [place] [address] [phone]  Date:  04/22/2017   Name:  [name]   DOB:  03-Aug-1950   MRN:  [id number]  PCP:  Darreld Mclean, MD    Chief Complaint: Hoarse (Pt being seen today due to being hoarse and having drainage. )   History of Present Illness:  [name] is a 67 y.o. very pleasant female patient who presents with the following:  Sick visit today- she just called this am for appt I saw her just about 2 weeks for for a CPE, at which time she was feeling ok She does have history of allergies and cough variant asthma   She has noted PND for about 2 weeks, really since she was last in Hoarse voice this morning  No fever noted, she generally feels ok  Mild cough she thinks due to the drainage She did have a ST yesterday, and a slight earache on the right a couple of days ago- now seems better No GI symptoms She is using Singulair, flonase nasal, allergra Her allergist stopped the symbicort- she is now on albuterol prn, and does have some flovent at home but is not using it  She tends to have allergy sx this time of year She has tolerated prednisone in the past She does not have DM  Lab Results  Component Value Date   HGBA1C 5.4 10/03/2016     Patient Active Problem List   Diagnosis Date Noted  . Recurrent urticaria 12/11/2016  . Angioedema 12/11/2016  . Cough variant asthma 12/11/2016  . Perennial allergic rhinitis 12/11/2016  . Routine history and physical examination of adult 04/06/2015  . Need for shingles vaccine 04/06/2015  . Environmental allergies 03/23/2015  . Hyperlipidemia 03/23/2015  . Gastro-esophageal reflux disease without esophagitis 02/04/2014  . 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 Osteopenia 07/20/2013    Past Medical History:  Diagnosis Date  . Environmental allergies   . Guillain Barr syndrome ([place])   .  Hearing loss    Hearing Aids, bilateral  . High cholesterol   . History of chicken pox     Past Surgical History:  Procedure Laterality Date  . ABDOMINAL HYSTERECTOMY    . EYE SURGERY    . FOOT SURGERY    . KNEE ARTHROSCOPY    . MEDIAL PARTIAL KNEE REPLACEMENT  07-1998,08-1998    Social History   Tobacco Use  . Smoking status: Former Research scientist (life sciences)  . Smokeless tobacco: Never Used  Substance Use Topics  . Alcohol use: No    Alcohol/week: 0.0 oz  . Drug use: No    Family History  Problem Relation Age of Onset  . COPD Mother   . Heart disease Mother   . Hypertension Father   . Crohn's disease Sister   . Hypertension Brother   . Heart disease Brother   . Kidney disease Son   . Tuberculosis Maternal Grandmother   . Leukemia Maternal Grandfather     Allergies  Allergen Reactions  . Penicillins Anaphylaxis and Rash  . Darvon [Propoxyphene] Swelling  . Pneumovax 23 [Pneumococcal Vac Polyvalent]     Localized redness and swelling   . Vicodin [Hydrocodone-Acetaminophen] Other (See Comments)    Unknown reaction  . Methocarbamol Rash    Medication list has been reviewed and updated.  Current Outpatient Medications on File Prior to Visit  Medication Sig Dispense Refill  .  albuterol (PROVENTIL HFA;VENTOLIN HFA) 108 (90 Base) MCG/ACT inhaler Inhale 2 puffs into the lungs every 6 (six) hours as needed for wheezing or shortness of breath. 1 Inhaler 0  . alendronate (FOSAMAX) 70 MG tablet Take 1 tablet (70 mg total) by mouth once a week. Take with a full glass of water on an empty stomach. 12 tablet 3  . Ascorbic Acid (VITAMIN C) 1000 MG tablet Take 1,000 mg by mouth daily.     Marland Kitchen atorvastatin (LIPITOR) 40 MG tablet TAKE 1 TABLET (40 MG TOTAL) BY MOUTH DAILY. 90 tablet 3  . azelastine (ASTELIN) 0.1 % nasal spray Two sprays each nostril twice a day as needed. 30 mL 5  . budesonide-formoterol (SYMBICORT) 80-4.5 MCG/ACT inhaler Inhale 2 puffs into the lungs 2 (two) times daily. (Patient  taking differently: Inhale 2 puffs daily as needed into the lungs. ) 3 Inhaler 6  . cholecalciferol (VITAMIN D) 1000 units tablet Take 1,000 Units daily by mouth.    . clindamycin (CLEOCIN) 300 MG capsule Take 2 (600mg ) by mouth once 30-60 min prior to dental procedure 10 capsule 0  . EPINEPHrine 0.3 mg/0.3 mL IJ SOAJ injection INJECT INTRAMUSCULARLY AS DIRECTED  0  . fexofenadine (ALLEGRA) 60 MG tablet Take 60 mg by mouth daily.    . fluticasone (FLONASE) 50 MCG/ACT nasal spray Place 2 sprays into both nostrils daily. 16 g 2  . fluticasone (FLOVENT HFA) 110 MCG/ACT inhaler Two puffs with spacer twice a day during respiratory flares 1 Inhaler 5  . hydrOXYzine (ATARAX/VISTARIL) 10 MG tablet Take 1 tablet (10 mg total) 3 (three) times daily as needed by mouth for itching. 30 tablet 1  . montelukast (SINGULAIR) 10 MG tablet Take 1 tablet (10 mg total) by mouth at bedtime. 90 tablet 1  . Multiple Vitamin (MULTI-VITAMINS) TABS Take 1 tablet by mouth daily.    . Omega-3 Fatty Acids (FISH OIL) 1000 MG CAPS Take 1 capsule by mouth daily.    . ranitidine (ZANTAC) 150 MG tablet One tablet one to two times a day as needed 60 tablet 5  . tizanidine (ZANAFLEX) 2 MG capsule Take 1 capsule (2 mg total) by mouth 3 (three) times daily as needed for muscle spasms. 30 capsule 1   No current facility-administered medications on file prior to visit.     Review of Systems:  As per HPI- otherwise negative.   Physical Examination: Vitals:   04/22/17 0938  BP: 104/76  Pulse: 67  Temp: 97.8 F (36.6 C)  SpO2: 97%   Vitals:   04/22/17 0938  Weight: 142 lb 9.6 oz (64.7 kg)  Height: 5\' 6"  (1.676 m)   Body mass index is 23.02 kg/m. Ideal Body Weight: Weight in (lb) to have BMI = 25: 154.6  GEN: WDWN, NAD, Non-toxic, A & O x 3   HEENT: Atraumatic, Normocephalic. Neck supple. No masses, No LAD.  Bilateral TM wnl, oropharynx normal.  PEERL,EOMI.   Ears and Nose: No external deformity. CV: RRR, No M/G/R. No  JVD. No thrill. No extra heart sounds. PULM: CTA B, no wheezes, crackles, rhonchi. No retractions. No resp. distress. No accessory muscle use. EXTR: No c/c/e NEURO Normal gait.  PSYCH: Normally interactive. Conversant. Not depressed or anxious appearing.  Calm demeanor.  Looks well, normal weight   Assessment and Plan: Non-seasonal allergic rhinitis, unspecified trigger - Plan: predniSONE (DELTASONE) 20 MG tablet  Recurrent allergy sx with PND and hoarse voice Will treat with a short course of prednisone 20 mg  She will let me know if not improving soon   Signed Lamar Blinks, MD

## 2017-04-24 ENCOUNTER — Encounter: Payer: Self-pay | Admitting: Family Medicine

## 2017-04-24 NOTE — Telephone Encounter (Signed)
Pt would need to call [facility] and schedule the bone density [phone]

## 2017-04-25 ENCOUNTER — Other Ambulatory Visit: Payer: Self-pay | Admitting: Emergency Medicine

## 2017-04-25 NOTE — Telephone Encounter (Signed)
Spoke to pt. Pt states that she normally comes to the [facility] office for her bone density test. Provided the number to imaging services [phone] so that pt can call to schedule appt. Dr. Lorelei Pont has already placed the order for the pt to have her bone density test.

## 2017-05-06 ENCOUNTER — Ambulatory Visit (HOSPITAL_BASED_OUTPATIENT_CLINIC_OR_DEPARTMENT_OTHER)
Admission: RE | Admit: 2017-05-06 | Discharge: 2017-05-06 | Disposition: A | Discharge status: Home / Self Care | Payer: PPO | Source: Ambulatory Visit | Attending: Family Medicine | Admitting: Family Medicine

## 2017-05-06 ENCOUNTER — Encounter: Payer: Self-pay | Admitting: Family Medicine

## 2017-05-06 DIAGNOSIS — M810 Age-related osteoporosis without current pathological fracture: Secondary | ICD-10-CM

## 2017-05-06 DIAGNOSIS — Z780 Asymptomatic menopausal state: Secondary | ICD-10-CM | POA: Diagnosis not present

## 2017-05-13 MED FILL — MONTELUKAST SOD 10 MG TAB: 10 | 30 days supply | Qty: 30 | Fill #1

## 2017-06-11 ENCOUNTER — Encounter: Payer: Self-pay | Admitting: Family Medicine

## 2017-06-11 DIAGNOSIS — Z9109 Other allergy status, other than to drugs and biological substances: Secondary | ICD-10-CM

## 2017-06-11 MED ORDER — FLUTICASONE PROPIONATE 50 MCG/ACT NA SUSP: 2.0000 | Freq: Every day | NASAL | 11 refills | Status: DC

## 2017-06-11 MED ORDER — AZELASTINE HCL 0.1 % NA SOLN: NASAL | 11 refills | Status: AC

## 2017-06-11 MED ORDER — ALENDRONATE SODIUM 70 MG PO TABS: 70.0000 mg | ORAL_TABLET | ORAL | 1 refills | Status: DC

## 2017-06-11 MED FILL — ALENDRONATE NA 70 MG TAB: 70 | 84 days supply | Qty: 12 | Fill #0

## 2017-06-11 NOTE — Addendum Note (Signed)
Addended by: Lamar Blinks C on: 06/11/2017 07:36 PM   Modules accepted: Orders

## 2017-06-12 MED FILL — AZELASTINE HCL 137 MCG/SPRA: 137 | 25 days supply | Qty: 30 | Fill #0

## 2017-06-13 MED ORDER — FLUTICASONE PROPIONATE 50 MCG/ACT NA SUSP: 2.0000 | Freq: Every day | NASAL | 3 refills | Status: DC

## 2017-06-13 MED FILL — FLUTICASONE PROP 50 MCG SPR: 50 | 90 days supply | Qty: 48 | Fill #0

## 2017-06-13 NOTE — Addendum Note (Signed)
Addended byDamita Dunnings D on: 06/13/2017 03:22 PM   Modules accepted: Orders

## 2017-06-24 MED FILL — MONTELUKAST SOD 10 MG TAB: 10 | 90 days supply | Qty: 90 | Fill #1

## 2017-07-08 MED FILL — ATORVASTATIN 40 MG TABLET: 40 | 90 days supply | Qty: 90 | Fill #1

## 2017-07-08 MED FILL — AZELASTINE HCL 137 MCG/SPRA: 137 | 25 days supply | Qty: 30 | Fill #1

## 2017-07-13 ENCOUNTER — Other Ambulatory Visit: Payer: Self-pay

## 2017-07-13 ENCOUNTER — Encounter (HOSPITAL_COMMUNITY): Payer: Self-pay | Admitting: Emergency Medicine

## 2017-07-13 ENCOUNTER — Ambulatory Visit (HOSPITAL_COMMUNITY)
Admission: EM | Admit: 2017-07-13 | Discharge: 2017-07-13 | Disposition: A | Discharge status: Home / Self Care | Payer: PPO | Attending: Internal Medicine | Admitting: Internal Medicine

## 2017-07-13 DIAGNOSIS — W2209XA Striking against other stationary object, initial encounter: Secondary | ICD-10-CM

## 2017-07-13 DIAGNOSIS — S91201A Unspecified open wound of right great toe with damage to nail, initial encounter: Secondary | ICD-10-CM

## 2017-07-13 DIAGNOSIS — S91209A Unspecified open wound of unspecified toe(s) with damage to nail, initial encounter: Secondary | ICD-10-CM

## 2017-07-13 MED ORDER — IBUPROFEN 800 MG PO TABS: 800.0000 mg | ORAL_TABLET | Freq: Three times a day (TID) | ORAL | 0 refills | Status: AC

## 2017-07-13 MED ORDER — LIDOCAINE HCL 2 % IJ SOLN
INTRAMUSCULAR | Status: AC
  Filled 2017-07-13: qty 20

## 2017-07-13 NOTE — ED Triage Notes (Signed)
The patient presented to the [facility] with a complaint of an injury to the great toe on her right foot that occurred today when she hit it with a suit case. The patient reported that the nail was partially removed.

## 2017-07-13 NOTE — Discharge Instructions (Addendum)
Soak toe in warm water at twice a day for the next 48 hours. Place a small amount of antibiotic ointment and cover with dry dressing. Do this for the next 7 days. Follow-up with podiatry in 1 week.

## 2017-07-13 NOTE — ED Provider Notes (Signed)
[facility]    CSN: [id number] Arrival date & time: 07/13/17  1853     History   Chief Complaint Chief Complaint  Patient presents with  . Foot Pain    HPI [name] is a 67 y.o. female.    [name] is a 67 y.o. female that complains of pain and trauma of the right great toe nail.  Patient accidentally hit the toe while pulling a suitcase from underneath her bed.  Onset of symptoms was abrupt starting 1 hour ago. Patient describes pain as throbbing. Pain severity now is severe. Pain is aggravated by movement and palpation. Pain is alleviated by nothing. Symptoms associated with pain include none. The patient denies other injuries.  Care prior to arrival consisted of ice, with no relief.        Past Medical History:  Diagnosis Date  . Environmental allergies   . Guillain Barr syndrome ([place])   . Hearing loss    Hearing Aids, bilateral  . High cholesterol   . History of chicken pox     Patient Active Problem List   Diagnosis Date Noted  . Recurrent urticaria 12/11/2016  . Angioedema 12/11/2016  . Cough variant asthma 12/11/2016  . Perennial allergic rhinitis 12/11/2016  . Routine history and physical examination of adult 04/06/2015  . Need for shingles vaccine 04/06/2015  . Environmental allergies 03/23/2015  . Hyperlipidemia 03/23/2015  . Gastro-esophageal reflux disease without esophagitis 02/04/2014  . 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 Osteopenia 07/20/2013    Past Surgical History:  Procedure Laterality Date  . ABDOMINAL HYSTERECTOMY    . EYE SURGERY    . FOOT SURGERY    . KNEE ARTHROSCOPY    . MEDIAL PARTIAL KNEE REPLACEMENT  07-1998,08-1998    OB History   None      Home Medications    Prior to Admission medications   Medication Sig Start Date End Date Taking? Authorizing Provider  albuterol (PROVENTIL HFA;VENTOLIN HFA) 108 (90 Base) MCG/ACT inhaler Inhale 2 puffs into the lungs every 6 (six)  hours as needed for wheezing or shortness of breath. 12/11/16   Bobbitt, Sedalia Muta, MD  alendronate (FOSAMAX) 70 MG tablet Take 1 tablet (70 mg total) by mouth once a week. Take with a full glass of water on an empty stomach. 06/11/17   Copland, Gay Filler, MD  Ascorbic Acid (VITAMIN C) 1000 MG tablet Take 1,000 mg by mouth daily.     [provider]  atorvastatin (LIPITOR) 40 MG tablet TAKE 1 TABLET (40 MG TOTAL) BY MOUTH DAILY. 04/19/17   Copland, Gay Filler, MD  azelastine (ASTELIN) 0.1 % nasal spray Two sprays each nostril twice a day as needed. 06/11/17   Copland, Gay Filler, MD  cholecalciferol (VITAMIN D) 1000 units tablet Take 1,000 Units daily by mouth.    [provider]  clindamycin (CLEOCIN) 300 MG capsule Take 2 (600mg ) by mouth once 30-60 min prior to dental procedure 12/26/16   Copland, Gay Filler, MD  EPINEPHrine 0.3 mg/0.3 mL IJ SOAJ injection INJECT INTRAMUSCULARLY AS DIRECTED 11/09/16   [provider]  fexofenadine (ALLEGRA) 60 MG tablet Take 60 mg by mouth daily.    [provider]  fluticasone (FLONASE) 50 MCG/ACT nasal spray Place 2 sprays into both nostrils daily. 06/13/17   Copland, Gay Filler, MD  fluticasone (FLOVENT HFA) 110 MCG/ACT inhaler Two puffs with spacer twice a day during respiratory flares 12/11/16   Bobbitt, Sedalia Muta, MD  hydrOXYzine (ATARAX/VISTARIL) 10 MG tablet Take 1 tablet (10 mg total) 3 (three) times daily as needed by mouth for itching. 12/05/16   Saguier, Percell Miller, PA-C  ibuprofen (ADVIL,MOTRIN) 800 MG tablet Take 1 tablet (800 mg total) by mouth 3 (three) times daily. 07/13/17   Enrique Sack, FNP  montelukast (SINGULAIR) 10 MG tablet Take 1 tablet (10 mg total) by mouth at bedtime. 01/10/17   Copland, Gay Filler, MD  Multiple Vitamin (MULTI-VITAMINS) TABS Take 1 tablet by mouth daily.    [provider]  Omega-3 Fatty Acids (FISH OIL) 1000 MG CAPS Take 1 capsule by mouth daily.    [provider]    predniSONE (DELTASONE) 20 MG tablet Take 1 pill daily for 5- 7 days 04/22/17   Copland, Gay Filler, MD  ranitidine (ZANTAC) 150 MG tablet One tablet one to two times a day as needed 12/11/16   Bobbitt, Sedalia Muta, MD  tizanidine (ZANAFLEX) 2 MG capsule Take 1 capsule (2 mg total) by mouth 3 (three) times daily as needed for muscle spasms. 10/03/16   Copland, Gay Filler, MD    Family History Family History  Problem Relation Age of Onset  . COPD Mother   . Heart disease Mother   . Hypertension Father   . Crohn's disease Sister   . Hypertension Brother   . Heart disease Brother   . Kidney disease Son   . Tuberculosis Maternal Grandmother   . Leukemia Maternal Grandfather     Social History Social History   Tobacco Use  . Smoking status: Former Research scientist (life sciences)  . Smokeless tobacco: Never Used  Substance Use Topics  . Alcohol use: No    Alcohol/week: 0.0 oz  . Drug use: No     Allergies   Penicillins; Darvon [propoxyphene]; Pneumovax 23 [pneumococcal vac polyvalent]; Vicodin [hydrocodone-acetaminophen]; and Methocarbamol   Review of Systems Review of Systems  Skin:       Right great toe nail pain, bleeding and partially removed   All other systems reviewed and are negative.    Physical Exam Triage Vital Signs ED Triage Vitals [07/13/17 1925]  Enc Vitals Group     BP (!) 177/89     Pulse Rate 67     Resp 18     Temp 98.4 F (36.9 C)     Temp Source Oral     SpO2 99 %     Weight      Height      Head Circumference      Peak Flow      Pain Score      Pain Loc      Pain Edu?      Excl. in [name]?    No data found.  Updated Vital Signs BP (!) 177/89 (BP Location: Right Arm)   Pulse 67   Temp 98.4 F (36.9 C) (Oral)   Resp 18   SpO2 99%   Visual Acuity Right Eye Distance:   Left Eye Distance:   Bilateral Distance:    Right Eye Near:   Left Eye Near:    Bilateral Near:     Physical Exam  Constitutional: She is oriented to person, place, and time. She appears  well-developed and well-nourished.  Cardiovascular: Normal rate and regular rhythm.  Pulmonary/Chest: Effort normal and breath sounds normal.  Musculoskeletal: Normal range of motion.  Neurological: She is alert and oriented to person, place, and time.  Skin: Skin is warm and dry.  Partial avulsion of great right toenail  noted.  No toe pain or bruising.  No ankle or foot pain noted.  Psychiatric: She has a normal mood and affect.     UC Treatments / Results  Labs (all labs ordered are listed, but only abnormal results are displayed) Labs Reviewed - No data to display  EKG None  Radiology No results found.  Procedures Nail Removal Date/Time: 07/13/2017 8:16 PM Performed by: Enrique Sack, FNP Authorized by: Wynona Luna, MD   Consent:    Consent obtained:  Verbal   Consent given by:  Patient   Risks discussed:  Bleeding, infection, pain and permanent nail deformity Location:    Foot:  R big toe Pre-procedure details:    Skin preparation:  Betadine Anesthesia (see MAR for exact dosages):    Anesthesia method:  Nerve block   Block needle gauge:  27 G   Block anesthetic:  Lidocaine 2% w/o epi   Block outcome:  Anesthesia achieved Nail Removal:    Nail removed:  Complete Post-procedure details:    Dressing:  Antibiotic ointment   Patient tolerance of procedure:  Tolerated well, no immediate complications   (including critical care time)  Medications Ordered in UC Medications - No data to display  Initial Impression / Assessment and Plan / UC Course  I have reviewed the triage vital signs and the nursing notes.  Pertinent labs & imaging results that were available during my care of the patient were reviewed by me and considered in my medical decision making (see chart for details).     67 year old female presenting with a traumatic avulsion of the right great toenail.  Digital nerve block done and nail was completely removed.  Patient tolerated  procedure without any immediate complications noted.  Post procedure and wound care discussed.  Ibuprofen as needed for pain.  Follow-up in 1 week with podiatry.  Today's evaluation has revealed no signs of a dangerous process. Discussed diagnosis with patient. Patient aware of their diagnosis, possible red flag symptoms to watch out for and need for close follow up. Patient understands verbal and written discharge instructions. Patient comfortable with plan and disposition.  Patient has a clear mental status at this time, good insight into illness (after discussion and teaching) and has clear judgment to make decisions regarding their care.  Documentation was completed with the aid of voice recognition software. Transcription may contain typographical errors. Final Clinical Impressions(s) / UC Diagnoses   Final diagnoses:  Traumatic avulsion of nail plate of toe, initial encounter     Discharge Instructions     Soak toe in warm water at twice a day for the next 48 hours. Place a small amount of antibiotic ointment and cover with dry dressing. Do this for the next 7 days. Follow-up with podiatry in 1 week.    ED Prescriptions    Medication Sig Dispense Auth. Provider   ibuprofen (ADVIL,MOTRIN) 800 MG tablet Take 1 tablet (800 mg total) by mouth 3 (three) times daily. 21 tablet Enrique Sack, FNP     Controlled Substance Prescriptions [place] Controlled Substance Registry consulted? Not Applicable   Enrique Sack, [place] 07/13/17 2056

## 2017-07-22 ENCOUNTER — Telehealth: Payer: Self-pay | Admitting: Podiatry

## 2017-07-22 NOTE — Telephone Encounter (Signed)
I called patient - she tried to explain the white area she was referring to, sounded like maybe some macerated skin just from having it wrapped up. I advised to continue with soaking, antibiotic ointment and bandaid until she is seen next week and to watch for signs of infection, which I discussed with her.

## 2017-07-22 NOTE — Telephone Encounter (Signed)
I was seen on 22 June for a nail avulsion at the urgent care. I was reading the papers to see what I have to do after 7 days because I've been wrapping it and putting the antibiotic on. I noticed it said something about white on the toenail and I have a little bit of white right on the top but its been there since the beginning. I'm just calling to find out any information. My number is [phone]. Thank you.

## 2017-07-23 ENCOUNTER — Ambulatory Visit: Payer: Self-pay | Admitting: *Deleted

## 2017-07-23 NOTE — Telephone Encounter (Signed)
Pt reports tongue swelling, onset last night. Sides of tongue only, mild-moderate. No new foods, meds. Did take OTC cough syrup last night and this am but has taken before without incident.  Denies SOB, dysphagia, no throat swelling, rash, itching. Pt states she is eating and drinking WNL. Denies pain, tenderness. Pt directed to ED per protocol.  States she will go to an UC.  At conclusion of call, pt stated "I'm looking at my tongue and now it looks normal."  Care advise given. Directed to ED if symptoms worsen. Pt states she is going to take a benadryl.   Reason for Disposition . All other adults with swollen tongue   (Exception: tongue swelling is a recurrent problem AND NO swelling at present)  Answer Assessment - Initial Assessment Questions 1. ONSET: "When did the swelling start?" (e.g., minutes, hours, days)     Last evening 2. LOCATION: "What part of the tongue is swollen?"     sides 3. SEVERITY: "How swollen is it?"     Moderately. 4. CAUSE: "What do you think is causing the tongue swelling?" (e.g., hx of angioedema, allergies)     Unsure 5. RECURRENT SYMPTOM: "Have you had tongue swelling before?" If so, ask: "When was the last time?" "What happened that time?"     Yes 9 years ago after knee replacement 6. OTHER SYMPTOMS: "Do you have any other symptoms?" (e.g., difficulty breathing, facial swelling)     no  Protocols used: TONGUE SWELLING-A-AH

## 2017-07-30 ENCOUNTER — Encounter: Payer: Self-pay | Admitting: Podiatry

## 2017-07-30 ENCOUNTER — Ambulatory Visit: Payer: PPO | Admitting: Podiatry

## 2017-07-30 VITALS — BP 126/71 | HR 62 | Resp 16

## 2017-07-30 DIAGNOSIS — S99921A Unspecified injury of right foot, initial encounter: Secondary | ICD-10-CM | POA: Diagnosis not present

## 2017-07-30 NOTE — Progress Notes (Signed)
Subjective:    Patient ID: [name], female    DOB: 08/05/1950, 67 y.o.   MRN: [id number]  HPI 67 year old female presents the office today for nail check.  She had a right big toenail removed at urgent care on July 13, 2017 after he got injured.  She states that she is been soaking in warm water and keeping Neosporin and a bandage on the area.  She denies any drainage or pus coming from the area and she denies any red streaks.  She was at the area checked although she thinks is healing well she is not sure.  No other concerns.   Review of Systems  All other systems reviewed and are negative.  Past Medical History:  Diagnosis Date  . Environmental allergies   . Guillain Barr syndrome ([place])   . Hearing loss    Hearing Aids, bilateral  . High cholesterol   . History of chicken pox     Past Surgical History:  Procedure Laterality Date  . ABDOMINAL HYSTERECTOMY    . EYE SURGERY    . FOOT SURGERY    . KNEE ARTHROSCOPY    . MEDIAL PARTIAL KNEE REPLACEMENT  07-1998,08-1998     Current Outpatient Medications:  .  albuterol (PROVENTIL HFA;VENTOLIN HFA) 108 (90 Base) MCG/ACT inhaler, Inhale 2 puffs into the lungs every 6 (six) hours as needed for wheezing or shortness of breath., Disp: 1 Inhaler, Rfl: 0 .  alendronate (FOSAMAX) 70 MG tablet, Take 1 tablet (70 mg total) by mouth once a week. Take with a full glass of water on an empty stomach., Disp: 12 tablet, Rfl: 1 .  Ascorbic Acid (VITAMIN C) 1000 MG tablet, Take 1,000 mg by mouth daily. , Disp: , Rfl:  .  atorvastatin (LIPITOR) 40 MG tablet, TAKE 1 TABLET (40 MG TOTAL) BY MOUTH DAILY., Disp: 90 tablet, Rfl: 3 .  azelastine (ASTELIN) 0.1 % nasal spray, Two sprays each nostril twice a day as needed., Disp: 30 mL, Rfl: 11 .  cholecalciferol (VITAMIN D) 1000 units tablet, Take 1,000 Units daily by mouth., Disp: , Rfl:  .  clindamycin (CLEOCIN) 300 MG capsule, Take 2 (600mg ) by mouth once 30-60 min prior to dental procedure, Disp:  10 capsule, Rfl: 0 .  EPINEPHrine 0.3 mg/0.3 mL IJ SOAJ injection, INJECT INTRAMUSCULARLY AS DIRECTED, Disp: , Rfl: 0 .  fexofenadine (ALLEGRA) 60 MG tablet, Take 60 mg by mouth daily., Disp: , Rfl:  .  fluticasone (FLONASE) 50 MCG/ACT nasal spray, Place 2 sprays into both nostrils daily., Disp: 48 g, Rfl: 3 .  fluticasone (FLOVENT HFA) 110 MCG/ACT inhaler, Two puffs with spacer twice a day during respiratory flares, Disp: 1 Inhaler, Rfl: 5 .  hydrOXYzine (ATARAX/VISTARIL) 10 MG tablet, Take 1 tablet (10 mg total) 3 (three) times daily as needed by mouth for itching., Disp: 30 tablet, Rfl: 1 .  ibuprofen (ADVIL,MOTRIN) 800 MG tablet, Take 1 tablet (800 mg total) by mouth 3 (three) times daily., Disp: 21 tablet, Rfl: 0 .  montelukast (SINGULAIR) 10 MG tablet, Take 1 tablet (10 mg total) by mouth at bedtime., Disp: 90 tablet, Rfl: 1 .  Multiple Vitamin (MULTI-VITAMINS) TABS, Take 1 tablet by mouth daily., Disp: , Rfl:  .  Omega-3 Fatty Acids (FISH OIL) 1000 MG CAPS, Take 1 capsule by mouth daily., Disp: , Rfl:  .  predniSONE (DELTASONE) 20 MG tablet, Take 1 pill daily for 5- 7 days, Disp: 7 tablet, Rfl: 0 .  tizanidine (ZANAFLEX) 2  MG capsule, Take 1 capsule (2 mg total) by mouth 3 (three) times daily as needed for muscle spasms., Disp: 30 capsule, Rfl: 1  Allergies  Allergen Reactions  . Penicillins Anaphylaxis and Rash  . Darvon [Propoxyphene] Swelling  . Pneumovax 23 [Pneumococcal Vac Polyvalent]     Localized redness and swelling   . Vicodin [Hydrocodone-Acetaminophen] Other (See Comments)    Unknown reaction  . Methocarbamol Rash        Objective:   Physical Exam General: AAO x3, NAD  Dermatological: Status post right hallux toenail removal of the nail that appears to be healing well.  Small granulation tissue present but overall it appears to be almost completely healed.  There is faint surrounding erythema to the nail bed but there is no increase in warmth, drainage or pus or  ascending cellulitis there is no edema.  No other open lesions.  Vascular: Dorsalis Pedis artery and Posterior Tibial artery pedal pulses are 2/4 bilateral with immedate capillary fill time. There is no pain with calf compression, swelling, warmth, erythema.   Neruologic: Grossly intact via light touch bilateral.  Protective threshold with Semmes Wienstein monofilament intact to all pedal sites bilateral.   Musculoskeletal: No gross boney pedal deformities bilateral. No pain, crepitus, or limitation noted with foot and ankle range of motion bilateral. Muscular strength 5/5 in all groups tested bilateral.  Gait: Unassisted, Nonantalgic.      Assessment & Plan:  67 year old female right hallux nail avulsion due to injury -Treatment options discussed including all alternatives, risks, and complications -Etiology of symptoms were discussed -Area appears to be healing well.  I want her to continue Epsom salts soaks twice a day cover with Neosporin and a bandage but can be the area uncovered at night.  Monitoring signs or symptoms of infection.  Cardiology was to let me know or sooner if any issues are to arise.  Discussed with her about the watch and wait to see how the toenail grows back and it goes back not as it was before to let me know if there is any issues in the meantime.  She agrees with this plan.  Trula Slade DPM

## 2017-07-30 NOTE — Patient Instructions (Addendum)

## 2017-08-21 MED FILL — AZELASTINE HCL 137 MCG/SPRA: 137 | 25 days supply | Qty: 30 | Fill #2

## 2017-09-09 MED FILL — tiZANidine HCL 2 MG TABS: 2 | 10 days supply | Qty: 30 | Fill #1

## 2017-09-09 MED FILL — ALENDRONATE NA 70 MG TAB: 70 | 84 days supply | Qty: 12 | Fill #1

## 2017-09-17 MED FILL — AZELASTINE HCL 137 MCG/SPRA: 137 | 25 days supply | Qty: 30 | Fill #3

## 2017-09-17 MED FILL — MONTELUKAST SOD 10 MG TAB: 10 | 30 days supply | Qty: 30 | Fill #2

## 2017-09-17 MED FILL — FLUTICASONE PROP 50 MCG SPR: 50 | 90 days supply | Qty: 48 | Fill #1

## 2017-09-20 ENCOUNTER — Ambulatory Visit: Payer: PPO | Admitting: Podiatry

## 2017-09-20 ENCOUNTER — Encounter: Payer: Self-pay | Admitting: Podiatry

## 2017-09-20 ENCOUNTER — Ambulatory Visit: Payer: PPO

## 2017-09-20 DIAGNOSIS — B070 Plantar wart: Secondary | ICD-10-CM | POA: Diagnosis not present

## 2017-09-20 DIAGNOSIS — M2041 Other hammer toe(s) (acquired), right foot: Secondary | ICD-10-CM

## 2017-09-20 DIAGNOSIS — Q828 Other specified congenital malformations of skin: Secondary | ICD-10-CM | POA: Diagnosis not present

## 2017-09-24 NOTE — Progress Notes (Signed)
Subjective: 67 year old female presents the office today for concerns of a painful area to the bottom of her right foot, pointing to submetatarsal 5 area.  She states this been ongoing for 6 to 8 months but is been getting more swollen recently since I last saw her.  She also has a spot on her right fifth toe as well as her second toe.  This is been ongoing for some time denies any pain to the area.  No recent treatment. Denies any systemic complaints such as fevers, chills, nausea, vomiting. No acute changes since last appointment, and no other complaints at this time.   Objective: AAO x3, NAD DP/PT pulses palpable bilaterally, CRT less than 3 seconds To the right foot the metatarsal 5 is a punctate annular hyperkeratotic lesion.  This appears to be more of a porokeratosis.  There is also hyperkeratotic lesion to the plantar aspect the right fifth as well as second toe.  This appears to be more verruca.  There is no painful lesions on the plantar aspect of the digits.  There is no edema, erythema, drainage or pus or any signs of infection present today.  No open lesions or pre-ulcerative lesions.  No pain with calf compression, swelling, warmth, erythema  Assessment: 67 year old female with porokeratosis, verruca  Plan: -All treatment options discussed with the patient including all alternatives, risks, complications.  -Lesions were sharply debrided without any complications or bleeding.  Discussed moisturizer to the callused area daily.  I also ordered a compound cream through [facility] for the warts.  Discussed application instructions, side effects. -Patient encouraged to call the office with any questions, concerns, change in symptoms.   Trula Slade DPM

## 2017-10-13 NOTE — Progress Notes (Addendum)
[place] at [facility], [place], [place] [address] [phone]  Date:  10/16/2017   Name:  [name]   DOB:  06/17/1950   MRN:  [id number]  PCP:  Darreld Mclean, MD    Chief Complaint: Hyperlipidemia (6 month follow up) and Medication Refill (hodroxyzine)   History of Present Illness:  [name] is a 67 y.o. very pleasant female patient who presents with the following:  Periodic follow-up visit today History of recurrent urticaria, cough variant asthma, hyperlipidemia, osteopenia Last seen here for a physical in March She is now splitting time between here and [place]- they bought a home in [place] which is near [place] Her 31 yo mother is also looking for a small home in that that area Her breathing is good right now- she has not generally needed her inhalers She does suffer from chronic allergies, and had hives earlier this year.  Her hives are now resolved, we were not able to determine the cause of her hives  She has been noted to have likely gilbert syndrome Will do labs for her today- she is fasting today Due for a flu shot Pneumonia- she has documentation of getting pneumovax 23 twice- the last time in 9/17, at which time she had a localized reaction.  ?was her first shot actually prevnar, this was not given here  Also due for a Td  Her mother did break her femur in May of this year but is doing better, she is living with Shamarie now and they are selling her home.  She is still back and forth between here and [place].   She did have a toenail avulsion recently and had to have the nail removed   She would like a refill of her hydroxyzine to have on hand in case of itching - she tends to be allergic to some biting insects in [place]    Patient Active Problem List   Diagnosis Date Noted  . Recurrent urticaria 12/11/2016  . Angioedema 12/11/2016  . Cough variant asthma 12/11/2016  .  Perennial allergic rhinitis 12/11/2016  . Hyperlipidemia 03/23/2015  . Gastro-esophageal reflux disease without esophagitis 02/04/2014  . 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 Osteopenia 07/20/2013  . Osteoarthritis 07/20/2013    Past Medical History:  Diagnosis Date  . Environmental allergies   . Guillain Barr syndrome ([place])   . Hearing loss    Hearing Aids, bilateral  . High cholesterol   . History of chicken pox     Past Surgical History:  Procedure Laterality Date  . ABDOMINAL HYSTERECTOMY    . EYE SURGERY    . FOOT SURGERY    . KNEE ARTHROSCOPY    . MEDIAL PARTIAL KNEE REPLACEMENT  07-1998,08-1998    Social History   Tobacco Use  . Smoking status: Former Research scientist (life sciences)  . Smokeless tobacco: Never Used  Substance Use Topics  . Alcohol use: No    Alcohol/week: 0.0 standard drinks  . Drug use: No    Family History  Problem Relation Age of Onset  . COPD Mother   . Heart disease Mother   . Hypertension Father   . Crohn's disease Sister   . Hypertension Brother   . Heart disease Brother   . Kidney disease Son   . Tuberculosis Maternal Grandmother   . Leukemia Maternal Grandfather     Allergies  Allergen Reactions  . Penicillins Anaphylaxis and Rash  .  Darvon [Propoxyphene] Swelling  . Pneumovax 23 [Pneumococcal Vac Polyvalent]     Localized redness and swelling   . Vicodin [Hydrocodone-Acetaminophen] Other (See Comments)    Unknown reaction  . Methocarbamol Rash    Medication list has been reviewed and updated.  Current Outpatient Medications on File Prior to Visit  Medication Sig Dispense Refill  . albuterol (PROVENTIL HFA;VENTOLIN HFA) 108 (90 Base) MCG/ACT inhaler Inhale 2 puffs into the lungs every 6 (six) hours as needed for wheezing or shortness of breath. 1 Inhaler 0  . alendronate (FOSAMAX) 70 MG tablet Take 1 tablet (70 mg total) by mouth once a week. Take with a full glass of water on an empty stomach. 12 tablet 1  . Ascorbic Acid  (VITAMIN C) 1000 MG tablet Take 1,000 mg by mouth daily.     Marland Kitchen atorvastatin (LIPITOR) 40 MG tablet TAKE 1 TABLET (40 MG TOTAL) BY MOUTH DAILY. 90 tablet 3  . azelastine (ASTELIN) 0.1 % nasal spray Two sprays each nostril twice a day as needed. 30 mL 11  . cholecalciferol (VITAMIN D) 1000 units tablet Take 1,000 Units daily by mouth.    . clindamycin (CLEOCIN) 300 MG capsule Take 2 (600mg ) by mouth once 30-60 min prior to dental procedure 10 capsule 0  . EPINEPHrine 0.3 mg/0.3 mL IJ SOAJ injection INJECT INTRAMUSCULARLY AS DIRECTED  0  . fexofenadine (ALLEGRA) 60 MG tablet Take 60 mg by mouth daily.    . fluticasone (FLONASE) 50 MCG/ACT nasal spray Place 2 sprays into both nostrils daily. 48 g 3  . fluticasone (FLOVENT HFA) 110 MCG/ACT inhaler Two puffs with spacer twice a day during respiratory flares 1 Inhaler 5  . ibuprofen (ADVIL,MOTRIN) 800 MG tablet Take 1 tablet (800 mg total) by mouth 3 (three) times daily. 21 tablet 0  . montelukast (SINGULAIR) 10 MG tablet Take 1 tablet (10 mg total) by mouth at bedtime. 90 tablet 1  . Multiple Vitamin (MULTI-VITAMINS) TABS Take 1 tablet by mouth daily.    Salley Scarlet [facility]  Wart Cream-  Cimetidine 2%, Deoxy-D-Glucose 0.2%, Fluorouracil-5 5%, Salicylic Acid 26% Apply 1-2 grams to affected area 3-4 times daily Qty. 120 gm 3 refills    . Omega-3 Fatty Acids (FISH OIL) 1000 MG CAPS Take 1 capsule by mouth daily.    . tizanidine (ZANAFLEX) 2 MG capsule Take 1 capsule (2 mg total) by mouth 3 (three) times daily as needed for muscle spasms. 30 capsule 1   No current facility-administered medications on file prior to visit.     Review of Systems: No fever or chills, no CP or SOB Overall feeling well! As per HPI- otherwise negative.   Physical Examination: Vitals:   10/16/17 0829  BP: 124/72  Pulse: 74  Resp: 16  Temp: 97.9 F (36.6 C)  SpO2: 100%   Vitals:   10/16/17 0829  Weight: 138 lb (62.6 kg)  Height: 5\' 6"  (1.676 m)    Body mass index is 22.27 kg/m. Ideal Body Weight: Weight in (lb) to have BMI = 25: 154.6  GEN: WDWN, NAD, Non-toxic, A & O x 3, looks well, slim build HEENT: Atraumatic, Normocephalic. Neck supple. No masses, No LAD.  Bilateral TM wnl, oropharynx normal.  PEERL,EOMI.   Ears and Nose: No external deformity. CV: RRR, No M/G/R. No JVD. No thrill. No extra heart sounds. PULM: CTA B, no wheezes, crackles, rhonchi. No retractions. No resp. distress. No accessory muscle use. ABD: S, NT, ND EXTR: No c/c/e NEURO  Normal gait.  PSYCH: Normally interactive. Conversant. Not depressed or anxious appearing.  Calm demeanor.  She is s/p great toenail removal on her right foot   Assessment and Plan: Mixed hyperlipidemia - Plan: Lipid panel  Elevated bilirubin - Plan: Comprehensive metabolic panel  Screening for diabetes mellitus - Plan: Comprehensive metabolic panel, Hemoglobin A1c  Screening for deficiency anemia - Plan: CBC  Immunization due - Plan: Flu vaccine HIGH DOSE PF (Fluzone High dose), Td vaccine greater than or equal to 7yo preservative free IM  Open wound of toe, initial encounter - Plan: Td vaccine greater than or equal to 7yo preservative free IM  Itching - Plan: hydrOXYzine (ATARAX/VISTARIL) 10 MG tablet  Age-related osteoporosis without current pathological fracture - Plan: alendronate (FOSAMAX) 70 MG tablet  Following up today Flu shot given Tetanus given Refilled meds as above Await her lab results and will be in touch with her   Signed Lamar Blinks, MD  Results for orders placed or performed in visit on 10/16/17  CBC  Result Value Ref Range   WBC 3.7 (L) 4.0 - 10.5 K/uL   RBC 4.62 3.87 - 5.11 Mil/uL   Platelets 181.0 150.0 - 400.0 K/uL   Hemoglobin 13.7 12.0 - 15.0 g/dL   HCT 39.8 36.0 - 46.0 %   MCV 86.2 78.0 - 100.0 fl   MCHC 34.3 30.0 - 36.0 g/dL   RDW 13.0 11.5 - 15.5 %  Comprehensive metabolic panel  Result Value Ref Range   Sodium 142 135 - 145  mEq/L   Potassium 3.9 3.5 - 5.1 mEq/L   Chloride 105 96 - 112 mEq/L   CO2 30 19 - 32 mEq/L   Glucose, Bld 94 70 - 99 mg/dL   BUN 15 6 - 23 mg/dL   Creatinine, Ser 0.71 0.40 - 1.20 mg/dL   Total Bilirubin 2.2 (H) 0.2 - 1.2 mg/dL   Alkaline Phosphatase 62 39 - 117 U/L   AST 22 0 - 37 U/L   ALT 25 0 - 35 U/L   Total Protein 7.2 6.0 - 8.3 g/dL   Albumin 4.5 3.5 - 5.2 g/dL   Calcium 9.3 8.4 - 10.5 mg/dL   GFR 86.95 >60.00 mL/min  Hemoglobin A1c  Result Value Ref Range   Hgb A1c MFr Bld 5.6 4.6 - 6.5 %  Lipid panel  Result Value Ref Range   Cholesterol 135 0 - 200 mg/dL   Triglycerides 85.0 0.0 - 149.0 mg/dL   HDL 59.20 >39.00 mg/dL   VLDL 17.0 0.0 - 40.0 mg/dL   LDL Cholesterol 59 0 - 99 mg/dL   Total CHOL/HDL Ratio 2    NonHDL 76.06    Message to pt re labs   Your white cell count is slightly low- this is likely a benign finding, we will plan to repeat at next labs Metabolic profile is normal except for elevated bilirubin which is longstanding  A1c is normal- no sign of diabetes Cholesterol looks great!    Take care,let's visit in about 6 months

## 2017-10-16 ENCOUNTER — Encounter: Payer: Self-pay | Admitting: Family Medicine

## 2017-10-16 ENCOUNTER — Ambulatory Visit (INDEPENDENT_AMBULATORY_CARE_PROVIDER_SITE_OTHER): Payer: PPO | Admitting: Family Medicine

## 2017-10-16 VITALS — BP 124/72 | HR 74 | Temp 97.9°F | Resp 16 | Ht 66.0 in | Wt 138.0 lb

## 2017-10-16 DIAGNOSIS — Z130 Encounter for screening for diseases of the blood and blood-forming organs and certain disorders involving the immune mechanism: Secondary | ICD-10-CM

## 2017-10-16 DIAGNOSIS — Z23 Encounter for immunization: Secondary | ICD-10-CM | POA: Diagnosis not present

## 2017-10-16 DIAGNOSIS — Z131 Encounter for screening for diabetes mellitus: Secondary | ICD-10-CM

## 2017-10-16 DIAGNOSIS — M810 Age-related osteoporosis without current pathological fracture: Secondary | ICD-10-CM | POA: Diagnosis not present

## 2017-10-16 DIAGNOSIS — S91109A Unspecified open wound of unspecified toe(s) without damage to nail, initial encounter: Secondary | ICD-10-CM | POA: Diagnosis not present

## 2017-10-16 DIAGNOSIS — R17 Unspecified jaundice: Secondary | ICD-10-CM

## 2017-10-16 DIAGNOSIS — E782 Mixed hyperlipidemia: Secondary | ICD-10-CM | POA: Diagnosis not present

## 2017-10-16 DIAGNOSIS — L299 Pruritus, unspecified: Secondary | ICD-10-CM | POA: Diagnosis not present

## 2017-10-16 LAB — LIPID PANEL
CHOL/HDL RATIO: 2
CHOLESTEROL: 135 mg/dL (ref 0–200)
HDL: 59.2 mg/dL (ref >39.00)
LDL Cholesterol: 59 mg/dL (ref 0–99)
NONHDL: 76.06
TRIGLYCERIDES: 85 mg/dL (ref 0.0–149.0)
VLDL: 17 mg/dL (ref 0.0–40.0)

## 2017-10-16 LAB — CBC
HCT: 39.8 % (ref 36.0–46.0)
HEMOGLOBIN: 13.7 g/dL (ref 12.0–15.0)
MCHC: 34.3 g/dL (ref 30.0–36.0)
MCV: 86.2 fl (ref 78.0–100.0)
Platelets: 181 10*3/uL (ref 150.0–400.0)
RBC: 4.62 Mil/uL (ref 3.87–5.11)
RDW: 13 % (ref 11.5–15.5)
WBC: 3.7 10*3/uL — AB (ref 4.0–10.5)

## 2017-10-16 LAB — COMPREHENSIVE METABOLIC PANEL
ALBUMIN: 4.5 g/dL (ref 3.5–5.2)
ALT: 25 U/L (ref 0–35)
AST: 22 U/L (ref 0–37)
Alkaline Phosphatase: 62 U/L (ref 39–117)
BUN: 15 mg/dL (ref 6–23)
CALCIUM: 9.3 mg/dL (ref 8.4–10.5)
CHLORIDE: 105 meq/L (ref 96–112)
CO2: 30 meq/L (ref 19–32)
CREATININE: 0.71 mg/dL (ref 0.40–1.20)
GFR: 86.95 mL/min (ref >60.00)
Glucose, Bld: 94 mg/dL (ref 70–99)
POTASSIUM: 3.9 meq/L (ref 3.5–5.1)
Sodium: 142 mEq/L (ref 135–145)
Total Bilirubin: 2.2 mg/dL — ABNORMAL HIGH (ref 0.2–1.2)
Total Protein: 7.2 g/dL (ref 6.0–8.3)

## 2017-10-16 LAB — HEMOGLOBIN A1C: HEMOGLOBIN A1C: 5.6 % (ref 4.6–6.5)

## 2017-10-16 MED ORDER — HYDROXYZINE HCL 10 MG PO TABS: 10.0000 mg | ORAL_TABLET | Freq: Three times a day (TID) | ORAL | 1 refills | Status: DC | PRN

## 2017-10-16 MED ORDER — ALENDRONATE SODIUM 70 MG PO TABS: 70.0000 mg | ORAL_TABLET | ORAL | 0 refills | Status: DC

## 2017-10-16 MED FILL — ATORVASTATIN 40 MG TABLET: 40 | 90 days supply | Qty: 90 | Fill #2

## 2017-10-16 MED FILL — hydrOXYzine HCL 10 MG TABS: 10 | 10 days supply | Qty: 30 | Fill #0

## 2017-10-16 MED FILL — MONTELUKAST SOD 10 MG TAB: 10 | 30 days supply | Qty: 30 | Fill #3

## 2017-10-16 NOTE — Patient Instructions (Addendum)
Flu shot and tetanus booster today I would suggest that you get your shingrix shingles vaccine at the drug store so insurance will help with the cost. However, this is not due yet anyway so we can consider it later  Please try to get your past pneumonia vaccine history for me if you can- we would much appreicate it!   I will be in touch with your labs, let's meet for a physical in 6 months Always good to see you!

## 2017-10-24 ENCOUNTER — Encounter: Payer: Self-pay | Admitting: Family Medicine

## 2017-10-25 ENCOUNTER — Ambulatory Visit: Payer: PPO | Admitting: Podiatry

## 2017-10-29 NOTE — Progress Notes (Signed)
[place] at [facility] [place], [place], [place] [address] [phone]  Date:  10/31/2017   Name:  [name]   DOB:  1950-11-08   MRN:  [id number]  PCP:  Darreld Mclean, MD    Chief Complaint: Back Pain (lower back, radiating to hips, had a massage this morning)   History of Present Illness:  [name] is a 67 y.o. very pleasant female patient who presents with the following:  History of arthritis and osteopenia Here today with concern of pain radiating from her sacrum into her buttocks bilaterally Has noted for about 2 weeks now  NKI, no strain that she can recall No weakness or numbness in her legs She does not typically have back pain  She did get a massage this am and was told that she had a couple of very tender spots in her back  It looks like she may have gotten 2 shots of pneumovax and never had a prevnar  However she did have a localized allergic rxn to pneumovax so we want to use caution with prevnar  No rash or skin lesion No fever or chills  Patient Active Problem List   Diagnosis Date Noted  . Recurrent urticaria 12/11/2016  . Angioedema 12/11/2016  . Cough variant asthma 12/11/2016  . Perennial allergic rhinitis 12/11/2016  . Hyperlipidemia 03/23/2015  . Gastro-esophageal reflux disease without esophagitis 02/04/2014  . 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 Osteopenia 07/20/2013  . Osteoarthritis 07/20/2013    Past Medical History:  Diagnosis Date  . Environmental allergies   . Guillain Barr syndrome ([place])   . Hearing loss    Hearing Aids, bilateral  . High cholesterol   . History of chicken pox     Past Surgical History:  Procedure Laterality Date  . ABDOMINAL HYSTERECTOMY    . EYE SURGERY    . FOOT SURGERY    . KNEE ARTHROSCOPY    . MEDIAL PARTIAL KNEE REPLACEMENT  07-1998,08-1998    Social History   Tobacco Use  . Smoking status: Former Research scientist (life sciences)   . Smokeless tobacco: Never Used  Substance Use Topics  . Alcohol use: No    Alcohol/week: 0.0 standard drinks  . Drug use: No    Family History  Problem Relation Age of Onset  . COPD Mother   . Heart disease Mother   . Hypertension Father   . Crohn's disease Sister   . Hypertension Brother   . Heart disease Brother   . Kidney disease Son   . Tuberculosis Maternal Grandmother   . Leukemia Maternal Grandfather     Allergies  Allergen Reactions  . Penicillins Anaphylaxis and Rash  . Darvon [Propoxyphene] Swelling  . Pneumovax 23 [Pneumococcal Vac Polyvalent]     Localized redness and swelling   . Vicodin [Hydrocodone-Acetaminophen] Other (See Comments)    Unknown reaction  . Methocarbamol Rash    Medication list has been reviewed and updated.  Current Outpatient Medications on File Prior to Visit  Medication Sig Dispense Refill  . albuterol (PROVENTIL HFA;VENTOLIN HFA) 108 (90 Base) MCG/ACT inhaler Inhale 2 puffs into the lungs every 6 (six) hours as needed for wheezing or shortness of breath. 1 Inhaler 0  . alendronate (FOSAMAX) 70 MG tablet Take 1 tablet (70 mg total) by mouth once a week. Take with a full glass of water on an empty stomach. 12 tablet 0  . Ascorbic Acid (VITAMIN C)  1000 MG tablet Take 1,000 mg by mouth daily.     Marland Kitchen atorvastatin (LIPITOR) 40 MG tablet TAKE 1 TABLET (40 MG TOTAL) BY MOUTH DAILY. 90 tablet 3  . azelastine (ASTELIN) 0.1 % nasal spray Two sprays each nostril twice a day as needed. 30 mL 11  . cholecalciferol (VITAMIN D) 1000 units tablet Take 1,000 Units daily by mouth.    . clindamycin (CLEOCIN) 300 MG capsule Take 2 (600mg ) by mouth once 30-60 min prior to dental procedure 10 capsule 0  . EPINEPHrine 0.3 mg/0.3 mL IJ SOAJ injection INJECT INTRAMUSCULARLY AS DIRECTED  0  . fexofenadine (ALLEGRA) 60 MG tablet Take 60 mg by mouth daily.    . fluticasone (FLONASE) 50 MCG/ACT nasal spray Place 2 sprays into both nostrils daily. 48 g 3  .  fluticasone (FLOVENT HFA) 110 MCG/ACT inhaler Two puffs with spacer twice a day during respiratory flares 1 Inhaler 5  . hydrOXYzine (ATARAX/VISTARIL) 10 MG tablet Take 1 tablet (10 mg total) by mouth 3 (three) times daily as needed for itching. 30 tablet 1  . ibuprofen (ADVIL,MOTRIN) 800 MG tablet Take 1 tablet (800 mg total) by mouth 3 (three) times daily. 21 tablet 0  . montelukast (SINGULAIR) 10 MG tablet Take 1 tablet (10 mg total) by mouth at bedtime. 90 tablet 1  . Multiple Vitamin (MULTI-VITAMINS) TABS Take 1 tablet by mouth daily.    Salley Scarlet [facility]  Wart Cream-  Cimetidine 2%, Deoxy-D-Glucose 0.2%, Fluorouracil-5 5%, Salicylic Acid 89% Apply 1-2 grams to affected area 3-4 times daily Qty. 120 gm 3 refills    . Omega-3 Fatty Acids (FISH OIL) 1000 MG CAPS Take 1 capsule by mouth daily.    . tizanidine (ZANAFLEX) 2 MG capsule Take 1 capsule (2 mg total) by mouth 3 (three) times daily as needed for muscle spasms. 30 capsule 1   No current facility-administered medications on file prior to visit.     Review of Systems:  As per HPI- otherwise negative. No CP or SOB    Physical Examination: Vitals:   10/31/17 1410  BP: 130/82  Pulse: 65  Resp: 16  SpO2: 97%   Vitals:   10/31/17 1410  Weight: 139 lb 9.6 oz (63.3 kg)  Height: 5\' 6"  (1.676 m)   Body mass index is 22.53 kg/m. Ideal Body Weight: Weight in (lb) to have BMI = 25: 154.6  GEN: WDWN, NAD, Non-toxic, A & O x 3. Slim build, looks well  HEENT: Atraumatic, Normocephalic. Neck supple. No masses, No LAD. Ears and Nose: No external deformity. CV: RRR, No M/G/R. No JVD. No thrill. No extra heart sounds. PULM: CTA B, no wheezes, crackles, rhonchi. No retractions. No resp. distress. No accessory muscle use. ABD: S, NT, ND EXTR: No c/c/e NEURO Normal gait.  PSYCH: Normally interactive. Conversant. Not depressed or anxious appearing.  Calm demeanor.  She endorses tenderness over the approx L3/4 spinal  level in her mid back and paraspinous muscles Excellent TL flexion and extension Negative SLR bilaterally Normal BLE strength and sensation, DTR  No rash or lesion    Assessment and Plan: Strain of lumbar region, initial encounter - Plan: DG Lumbar Spine Complete, cyclobenzaprine (FLEXERIL) 10 MG tablet  Immunization due - Plan: CANCELED: Pneumococcal conjugate vaccine 13-valent IM  Lower back strain She is using aleve that helps some Add prn flexeril. Cautioned regarding sedation with this med  Obtain lumbar films today  discueeed how to pre-medicate for her prevnar in the future  Signed Lamar Blinks, MD

## 2017-10-31 ENCOUNTER — Ambulatory Visit (HOSPITAL_BASED_OUTPATIENT_CLINIC_OR_DEPARTMENT_OTHER)
Admission: RE | Admit: 2017-10-31 | Discharge: 2017-10-31 | Disposition: A | Discharge status: Home / Self Care | Payer: PPO | Source: Ambulatory Visit | Attending: Family Medicine | Admitting: Family Medicine

## 2017-10-31 ENCOUNTER — Ambulatory Visit (INDEPENDENT_AMBULATORY_CARE_PROVIDER_SITE_OTHER): Payer: PPO | Admitting: Family Medicine

## 2017-10-31 ENCOUNTER — Encounter: Payer: Self-pay | Admitting: Family Medicine

## 2017-10-31 VITALS — BP 130/82 | HR 65 | Resp 16 | Ht 66.0 in | Wt 139.6 lb

## 2017-10-31 DIAGNOSIS — S39012A Strain of muscle, fascia and tendon of lower back, initial encounter: Secondary | ICD-10-CM | POA: Diagnosis not present

## 2017-10-31 DIAGNOSIS — M5136 Other intervertebral disc degeneration, lumbar region: Secondary | ICD-10-CM | POA: Insufficient documentation

## 2017-10-31 DIAGNOSIS — X58XXXA Exposure to other specified factors, initial encounter: Secondary | ICD-10-CM | POA: Diagnosis not present

## 2017-10-31 DIAGNOSIS — Z23 Encounter for immunization: Secondary | ICD-10-CM | POA: Diagnosis not present

## 2017-10-31 DIAGNOSIS — M47816 Spondylosis without myelopathy or radiculopathy, lumbar region: Secondary | ICD-10-CM | POA: Diagnosis not present

## 2017-10-31 MED ORDER — CYCLOBENZAPRINE HCL 10 MG PO TABS: 10.0000 mg | ORAL_TABLET | Freq: Two times a day (BID) | ORAL | 0 refills | Status: AC | PRN

## 2017-10-31 MED FILL — CYCLOBENZAPRINE HCL 10 MG T: 10 | 15 days supply | Qty: 30 | Fill #0

## 2017-10-31 MED FILL — AZELASTINE HCL 137 MCG/SPRA: 137 | 25 days supply | Qty: 30 | Fill #4

## 2017-10-31 NOTE — Patient Instructions (Addendum)
We are going to treat you for a lumbar strain with flexeril - muscle relaxer- and you can also continue your naproxen as needed We will get x-rays of your back today Please let me know how your back does!  I spoke to the pharmacy about your pneumonia vaccine They did recommend pre-treating you with benadryl and tylenol.  Perhaps lets do this another day when you are not about to leave town!  Also, I would recommend that someone drive you if you are going to take benadryl

## 2017-11-12 MED FILL — MONTELUKAST SOD 10 MG TAB: 10 | 30 days supply | Qty: 30 | Fill #4

## 2018-01-10 ENCOUNTER — Other Ambulatory Visit: Payer: Self-pay | Admitting: Allergy and Immunology

## 2018-01-10 MED FILL — AZELASTINE HCL 137 MCG/SPRA: 137 | 25 days supply | Qty: 30 | Fill #5

## 2018-01-10 MED FILL — FLUTICASONE PROP 50 MCG SPR: 50 | 90 days supply | Qty: 48 | Fill #2

## 2018-01-10 MED FILL — ATORVASTATIN 40 MG TABLET: 40 | 90 days supply | Qty: 90 | Fill #3

## 2018-01-13 ENCOUNTER — Other Ambulatory Visit: Payer: Self-pay | Admitting: Family Medicine

## 2018-01-13 MED FILL — MONTELUKAST SOD 10 MG TAB: 10 | 90 days supply | Qty: 90 | Fill #0

## 2018-01-24 ENCOUNTER — Encounter: Payer: Self-pay | Admitting: Family Medicine

## 2018-01-24 DIAGNOSIS — E785 Hyperlipidemia, unspecified: Secondary | ICD-10-CM

## 2018-01-24 MED ORDER — ATORVASTATIN CALCIUM 40 MG PO TABS: 40.0000 mg | ORAL_TABLET | Freq: Every day | ORAL | 3 refills | Status: AC

## 2018-02-19 ENCOUNTER — Other Ambulatory Visit: Payer: Self-pay | Admitting: Family Medicine

## 2018-02-19 DIAGNOSIS — M810 Age-related osteoporosis without current pathological fracture: Secondary | ICD-10-CM

## 2018-02-19 MED ORDER — ALENDRONATE SODIUM 70 MG PO TABS: 70.0000 mg | ORAL_TABLET | ORAL | 3 refills | Status: DC

## 2018-02-20 ENCOUNTER — Other Ambulatory Visit: Payer: Self-pay | Admitting: Family Medicine

## 2018-02-20 DIAGNOSIS — M810 Age-related osteoporosis without current pathological fracture: Secondary | ICD-10-CM

## 2018-02-20 MED ORDER — ALENDRONATE SODIUM 70 MG PO TABS
70.0000 mg | ORAL_TABLET | ORAL | 3 refills | Status: AC
Start: 2018-02-20 — End: ?

## 2018-02-20 MED FILL — ALENDRONATE NA 70 MG TAB: 70 | 84 days supply | Qty: 12 | Fill #0

## 2018-04-28 ENCOUNTER — Telehealth: Payer: Self-pay | Admitting: Family Medicine

## 2018-04-28 ENCOUNTER — Encounter: Payer: Self-pay | Admitting: Family Medicine

## 2018-04-28 NOTE — Telephone Encounter (Signed)
Spoke with [name] today. She wanted to cancel her 6 month follow up scheduled for 05/05/18, but she would like to know if she still needs to get a pneumonia shot and for someone to give her a call to let her know.

## 2018-04-29 NOTE — Telephone Encounter (Signed)
See mychart message. I do not want to cancel her appointment., I would like to offer patient webex-please advise on injection.

## 2018-04-29 NOTE — Telephone Encounter (Signed)
She told me that she did a wellness check in [place].  I told her to just wait on the pneumonia booster for now I think ok to cancel this app- thank you for thinking proactively however

## 2018-05-05 ENCOUNTER — Ambulatory Visit: Payer: PPO | Admitting: Family Medicine

## 2018-05-05 ENCOUNTER — Ambulatory Visit: Payer: PPO | Admitting: *Deleted

## 2018-09-29 NOTE — Progress Notes (Addendum)
[place] [facility] [address] [place], [place] [address] [phone]  Date:  10/02/2018   Name:  [name]   DOB:  January 29, 1950   MRN:  [id number]  PCP:  Darreld Mclean, MD    Chief Complaint: Lab Work and Flu Vaccine   History of Present Illness:  [name] is a 68 y.o. very pleasant female patient who presents with the following:  Here today for a follow-up visit Last seen by myself in [place] for a back strain History of recurrent urticaria, cough variant asthma, hyperlipidemia, osteopenia, Glbert syndrome She splits her time between [place] and [place] - they actually plan to move to [place] full time this fall Her elderly mother fell last year and broke her femur-thankfully she has recovered.  Her mother will also moving to [place] and will be living with Vaughan Basta  Mammo: pt had done in the last couple of months but needs to bring me the report Flu: give today  Colon is UTD Dexa: last year Labs are due- she is fasting   She had a reaction to pneumovax in the past- we are not certain if she had prevnar yet or had 2 doses of pnemovax by mistake, and indicates she prefers not to have further pneumonia vaccine  Her meds are all UTD lipitor Atarax prn  Fosamax singulair   No bothersome side effects of medications noted She keeps fit, does a lot of walking No chest pain or shortness of breath  Patient Active Problem List   Diagnosis Date Noted  . Recurrent urticaria 12/11/2016  . Angioedema 12/11/2016  . Cough variant asthma 12/11/2016  . Perennial allergic rhinitis 12/11/2016  . Hyperlipidemia 03/23/2015  . Gastro-esophageal reflux disease without esophagitis 02/04/2014  . 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 Osteopenia 07/20/2013  . Osteoarthritis 07/20/2013    Past Medical History:  Diagnosis Date  . Environmental allergies   . Guillain Barr syndrome ([place])   . Hearing  loss    Hearing Aids, bilateral  . High cholesterol   . History of chicken pox     Past Surgical History:  Procedure Laterality Date  . ABDOMINAL HYSTERECTOMY    . EYE SURGERY    . FOOT SURGERY    . KNEE ARTHROSCOPY    . MEDIAL PARTIAL KNEE REPLACEMENT  07-1998,08-1998    Social History   Tobacco Use  . Smoking status: Former Research scientist (life sciences)  . Smokeless tobacco: Never Used  Substance Use Topics  . Alcohol use: No    Alcohol/week: 0.0 standard drinks  . Drug use: No    Family History  Problem Relation Age of Onset  . COPD Mother   . Heart disease Mother   . Hypertension Father   . Crohn's disease Sister   . Hypertension Brother   . Heart disease Brother   . Kidney disease Son   . Tuberculosis Maternal Grandmother   . Leukemia Maternal Grandfather     Allergies  Allergen Reactions  . Penicillins Anaphylaxis and Rash  . Darvon [Propoxyphene] Swelling  . Pneumovax 23 [Pneumococcal Vac Polyvalent]     Localized redness and swelling   . Vicodin [Hydrocodone-Acetaminophen] Other (See Comments)    Unknown reaction  . Methocarbamol Rash    Medication list has been reviewed and updated.  Current Outpatient Medications on File Prior to Visit  Medication Sig Dispense Refill  . albuterol (PROVENTIL HFA;VENTOLIN HFA) 108 (90 Base) MCG/ACT inhaler Inhale  2 puffs into the lungs every 6 (six) hours as needed for wheezing or shortness of breath. 1 Inhaler 0  . alendronate (FOSAMAX) 70 MG tablet Take 1 tablet (70 mg total) by mouth once a week. Take with a full glass of water on an empty stomach. 12 tablet 3  . Ascorbic Acid (VITAMIN C) 1000 MG tablet Take 1,000 mg by mouth daily.     Marland Kitchen atorvastatin (LIPITOR) 40 MG tablet Take 1 tablet (40 mg total) by mouth daily. 90 tablet 3  . azelastine (ASTELIN) 0.1 % nasal spray Two sprays each nostril twice a day as needed. 30 mL 11  . cholecalciferol (VITAMIN D) 1000 units tablet Take 1,000 Units daily by mouth.    . cyclobenzaprine (FLEXERIL)  10 MG tablet Take 1 tablet (10 mg total) by mouth 2 (two) times daily as needed for muscle spasms. Can start with 1/2 pill 30 tablet 0  . fexofenadine (ALLEGRA) 60 MG tablet Take 60 mg by mouth daily.    . fluticasone (FLOVENT HFA) 110 MCG/ACT inhaler Two puffs with spacer twice a day during respiratory flares 1 Inhaler 5  . ibuprofen (ADVIL,MOTRIN) 800 MG tablet Take 1 tablet (800 mg total) by mouth 3 (three) times daily. 21 tablet 0  . montelukast (SINGULAIR) 10 MG tablet TAKE 1 TABLET (10 MG TOTAL) BY MOUTH AT BEDTIME. 90 tablet 1  . Multiple Vitamin (MULTI-VITAMINS) TABS Take 1 tablet by mouth daily.    Salley Scarlet [facility]  Wart Cream-  Cimetidine 2%, Deoxy-D-Glucose 0.2%, Fluorouracil-5 5%, Salicylic Acid 0000000 Apply 1-2 grams to affected area 3-4 times daily Qty. 120 gm 3 refills    . Omega-3 Fatty Acids (FISH OIL) 1000 MG CAPS Take 1 capsule by mouth daily.     No current facility-administered medications on file prior to visit.     Review of Systems:  As per HPI- otherwise negative. She is walking for exercise- stays fit   Physical Examination: Vitals:   10/02/18 0824  BP: 124/80  Pulse: 75  Resp: 16  Temp: 97.7 F (36.5 C)  SpO2: 98%   Vitals:   10/02/18 0824  Weight: 142 lb (64.4 kg)  Height: 5\' 6"  (1.676 m)   Body mass index is 22.92 kg/m. Ideal Body Weight: Weight in (lb) to have BMI = 25: 154.6  GEN: WDWN, NAD, Non-toxic, A & O x 3, normal weight, looks well and younger than age 29: Atraumatic, Normocephalic. Neck supple. No masses, No LAD.  Bilateral TM wnl, oropharynx normal.  PEERL,EOMI.   Ears and Nose: No external deformity. CV: RRR, No M/G/R. No JVD. No thrill. No extra heart sounds. PULM: CTA B, no wheezes, crackles, rhonchi. No retractions. No resp. distress. No accessory muscle use. ABD: S, NT, ND, +BS. No rebound. No HSM. EXTR: No c/c/e NEURO Normal gait.  PSYCH: Normally interactive. Conversant. Not depressed or anxious  appearing.  Calm demeanor.    Assessment and Plan: Hyperlipidemia, unspecified hyperlipidemia type - Plan: Lipid panel  Age-related osteoporosis without current pathological fracture  Screening for diabetes mellitus - Plan: Comprehensive metabolic panel, Hemoglobin A1c  Screening for deficiency anemia - Plan: CBC  Screening for breast cancer  Serum calcium elevated - Plan: PTH, intact (no Ca), Vitamin D (25 hydroxy)  Needs flu shot - Plan: Flu Vaccine QUAD High Dose(Fluad)  Here today for follow-up visit We will check cholesterol, A1c, routine labs Mammogram is up-to-date She saw her doctor in [place] about 6 months ago, they asked her  to have a PTH and vitamin D level drawn.  I presume her calcium may have been elevated? We will draw these labs today as requested We wish her all the best in [place]!   Signed Lamar Blinks, MD  Received her labs, message to patient Results for orders placed or performed in visit on 10/02/18  CBC  Result Value Ref Range   WBC 3.4 (L) 4.0 - 10.5 K/uL   RBC 4.55 3.87 - 5.11 Mil/uL   Platelets 172.0 150.0 - 400.0 K/uL   Hemoglobin 13.5 12.0 - 15.0 g/dL   HCT 39.3 36.0 - 46.0 %   MCV 86.4 78.0 - 100.0 fl   MCHC 34.4 30.0 - 36.0 g/dL   RDW 12.9 11.5 - 15.5 %  Comprehensive metabolic panel  Result Value Ref Range   Sodium 141 135 - 145 mEq/L   Potassium 3.8 3.5 - 5.1 mEq/L   Chloride 103 96 - 112 mEq/L   CO2 31 19 - 32 mEq/L   Glucose, Bld 88 70 - 99 mg/dL   BUN 13 6 - 23 mg/dL   Creatinine, Ser 0.75 0.40 - 1.20 mg/dL   Total Bilirubin 2.2 (H) 0.2 - 1.2 mg/dL   Alkaline Phosphatase 59 39 - 117 U/L   AST 25 0 - 37 U/L   ALT 30 0 - 35 U/L   Total Protein 7.0 6.0 - 8.3 g/dL   Albumin 4.3 3.5 - 5.2 g/dL   Calcium 9.8 8.4 - 10.5 mg/dL   GFR 76.58 >60.00 mL/min  Hemoglobin A1c  Result Value Ref Range   Hgb A1c MFr Bld 5.6 4.6 - 6.5 %  Lipid panel  Result Value Ref Range   Cholesterol 131 0 - 200 mg/dL   Triglycerides 107.0 0.0 -  149.0 mg/dL   HDL 55.30 >39.00 mg/dL   VLDL 21.4 0.0 - 40.0 mg/dL   LDL Cholesterol 54 0 - 99 mg/dL   Total CHOL/HDL Ratio 2    NonHDL 75.67   PTH, intact (no Ca)  Result Value Ref Range   PTH 16 14 - 64 pg/mL  Vitamin D (25 hydroxy)  Result Value Ref Range   VITD 55.67 30.00 - 100.00 ng/mL   Received her PTH- normal, message to pt 9/11

## 2018-09-29 NOTE — Patient Instructions (Addendum)
Great to see you today- I will be in touch with your labs asap  We wish you all the best on your new adventure in [place]. Take care!     Health Maintenance After Age 69 After age 27, you are at a higher risk for certain long-term diseases and infections as well as injuries from falls. Falls are a major cause of broken bones and head injuries in people who are older than age 70. Getting regular preventive care can help to keep you healthy and well. Preventive care includes getting regular testing and making lifestyle changes as recommended by your health care provider. Talk with your health care provider about:  Which screenings and tests you should have. A screening is a test that checks for a disease when you have no symptoms.  A diet and exercise plan that is right for you. What should I know about screenings and tests to prevent falls? Screening and testing are the best ways to find a health problem early. Early diagnosis and treatment give you the best chance of managing medical conditions that are common after age 59. Certain conditions and lifestyle choices may make you more likely to have a fall. Your health care provider may recommend:  Regular vision checks. Poor vision and conditions such as cataracts can make you more likely to have a fall. If you wear glasses, make sure to get your prescription updated if your vision changes.  Medicine review. Work with your health care provider to regularly review all of the medicines you are taking, including over-the-counter medicines. Ask your health care provider about any side effects that may make you more likely to have a fall. Tell your health care provider if any medicines that you take make you feel dizzy or sleepy.  Osteoporosis screening. Osteoporosis is a condition that causes the bones to get weaker. This can make the bones weak and cause them to break more easily.  Blood pressure screening. Blood pressure changes and medicines to  control blood pressure can make you feel dizzy.  Strength and balance checks. Your health care provider may recommend certain tests to check your strength and balance while standing, walking, or changing positions.  Foot health exam. Foot pain and numbness, as well as not wearing proper footwear, can make you more likely to have a fall.  Depression screening. You may be more likely to have a fall if you have a fear of falling, feel emotionally low, or feel unable to do activities that you used to do.  Alcohol use screening. Using too much alcohol can affect your balance and may make you more likely to have a fall. What actions can I take to lower my risk of falls? General instructions  Talk with your health care provider about your risks for falling. Tell your health care provider if: ? You fall. Be sure to tell your health care provider about all falls, even ones that seem minor. ? You feel dizzy, sleepy, or off-balance.  Take over-the-counter and prescription medicines only as told by your health care provider. These include any supplements.  Eat a healthy diet and maintain a healthy weight. A healthy diet includes low-fat dairy products, low-fat (lean) meats, and fiber from whole grains, beans, and lots of fruits and vegetables. Home safety  Remove any tripping hazards, such as rugs, cords, and clutter.  Install safety equipment such as grab bars in bathrooms and safety rails on stairs.  Keep rooms and walkways well-lit. Activity   Follow a  regular exercise program to stay fit. This will help you maintain your balance. Ask your health care provider what types of exercise are appropriate for you.  If you need a cane or walker, use it as recommended by your health care provider.  Wear supportive shoes that have nonskid soles. Lifestyle  Do not drink alcohol if your health care provider tells you not to drink.  If you drink alcohol, limit how much you have: ? 0-1 drink a day  for women. ? 0-2 drinks a day for men.  Be aware of how much alcohol is in your drink. In the [place] one drink equals one typical bottle of beer (12 oz), one-half glass of wine (5 oz), or one shot of hard liquor (1 oz).  Do not use any products that contain nicotine or tobacco, such as cigarettes and e-cigarettes. If you need help quitting, ask your health care provider. Summary  Having a healthy lifestyle and getting preventive care can help to protect your health and wellness after age 70.  Screening and testing are the best way to find a health problem early and help you avoid having a fall. Early diagnosis and treatment give you the best chance for managing medical conditions that are more common for people who are older than age 69.  Falls are a major cause of broken bones and head injuries in people who are older than age 69. Take precautions to prevent a fall at home.  Work with your health care provider to learn what changes you can make to improve your health and wellness and to prevent falls. This information is not intended to replace advice given to you by your health care provider. Make sure you discuss any questions you have with your health care provider. Document Released: 11/21/2016 Document Revised: 05/01/2018 Document Reviewed: 11/21/2016 Elsevier Patient Education  2020 [employer].

## 2018-10-01 ENCOUNTER — Other Ambulatory Visit: Payer: Self-pay

## 2018-10-02 ENCOUNTER — Ambulatory Visit (INDEPENDENT_AMBULATORY_CARE_PROVIDER_SITE_OTHER): Payer: Medicare PPO | Admitting: Family Medicine

## 2018-10-02 ENCOUNTER — Encounter: Payer: Self-pay | Admitting: Family Medicine

## 2018-10-02 VITALS — BP 124/80 | HR 75 | Temp 97.7°F | Resp 16 | Ht 66.0 in | Wt 142.0 lb

## 2018-10-02 DIAGNOSIS — Z131 Encounter for screening for diabetes mellitus: Secondary | ICD-10-CM

## 2018-10-02 DIAGNOSIS — Z23 Encounter for immunization: Secondary | ICD-10-CM

## 2018-10-02 DIAGNOSIS — Z130 Encounter for screening for diseases of the blood and blood-forming organs and certain disorders involving the immune mechanism: Secondary | ICD-10-CM

## 2018-10-02 DIAGNOSIS — E785 Hyperlipidemia, unspecified: Secondary | ICD-10-CM

## 2018-10-02 DIAGNOSIS — Z1239 Encounter for other screening for malignant neoplasm of breast: Secondary | ICD-10-CM

## 2018-10-02 DIAGNOSIS — M810 Age-related osteoporosis without current pathological fracture: Secondary | ICD-10-CM

## 2018-10-02 LAB — CBC
HCT: 39.3 % (ref 36.0–46.0)
Hemoglobin: 13.5 g/dL (ref 12.0–15.0)
MCHC: 34.4 g/dL (ref 30.0–36.0)
MCV: 86.4 fl (ref 78.0–100.0)
Platelets: 172 10*3/uL (ref 150.0–400.0)
RBC: 4.55 Mil/uL (ref 3.87–5.11)
RDW: 12.9 % (ref 11.5–15.5)
WBC: 3.4 10*3/uL — ABNORMAL LOW (ref 4.0–10.5)

## 2018-10-02 LAB — LIPID PANEL
Cholesterol: 131 mg/dL (ref 0–200)
HDL: 55.3 mg/dL (ref >39.00)
LDL Cholesterol: 54 mg/dL (ref 0–99)
NonHDL: 75.67
Total CHOL/HDL Ratio: 2
Triglycerides: 107 mg/dL (ref 0.0–149.0)
VLDL: 21.4 mg/dL (ref 0.0–40.0)

## 2018-10-02 LAB — COMPREHENSIVE METABOLIC PANEL
ALT: 30 U/L (ref 0–35)
AST: 25 U/L (ref 0–37)
Albumin: 4.3 g/dL (ref 3.5–5.2)
Alkaline Phosphatase: 59 U/L (ref 39–117)
BUN: 13 mg/dL (ref 6–23)
CO2: 31 mEq/L (ref 19–32)
Calcium: 9.8 mg/dL (ref 8.4–10.5)
Chloride: 103 mEq/L (ref 96–112)
Creatinine, Ser: 0.75 mg/dL (ref 0.40–1.20)
GFR: 76.58 mL/min (ref >60.00)
Glucose, Bld: 88 mg/dL (ref 70–99)
Potassium: 3.8 mEq/L (ref 3.5–5.1)
Sodium: 141 mEq/L (ref 135–145)
Total Bilirubin: 2.2 mg/dL — ABNORMAL HIGH (ref 0.2–1.2)
Total Protein: 7 g/dL (ref 6.0–8.3)

## 2018-10-02 LAB — HEMOGLOBIN A1C: Hgb A1c MFr Bld: 5.6 % (ref 4.6–6.5)

## 2018-10-02 LAB — VITAMIN D 25 HYDROXY (VIT D DEFICIENCY, FRACTURES): VITD: 55.67 ng/mL (ref 30.00–100.00)

## 2018-10-03 LAB — PARATHYROID HORMONE, INTACT (NO CA): PTH: 16 pg/mL (ref 14–64)
